# Patient Record
Sex: Female | Born: 1995 | Race: Black or African American | Hispanic: No | Marital: Single | Smoking: Never smoker
Health system: Southern US, Community
[De-identification: ages and names within clinical notes are randomized; demographics above are authoritative.]

## PROBLEM LIST (undated history)

## (undated) DIAGNOSIS — D649 Anemia, unspecified: Secondary | ICD-10-CM

## (undated) DIAGNOSIS — J45909 Unspecified asthma, uncomplicated: Secondary | ICD-10-CM

## (undated) DIAGNOSIS — Z87448 Personal history of other diseases of urinary system: Secondary | ICD-10-CM

## (undated) HISTORY — DX: Personal history of other diseases of urinary system: Z87.448

## (undated) HISTORY — DX: Anemia, unspecified: D64.9

---

## 2020-06-29 ENCOUNTER — Other Ambulatory Visit: Payer: Self-pay

## 2020-06-29 ENCOUNTER — Emergency Department
Admission: EM | Admit: 2020-06-29 | Discharge: 2020-06-29 | Disposition: A | Discharge status: Home / Self Care | Payer: HRSA Program | Attending: Emergency Medicine | Admitting: Emergency Medicine

## 2020-06-29 ENCOUNTER — Encounter: Payer: Self-pay | Admitting: Emergency Medicine

## 2020-06-29 ENCOUNTER — Emergency Department: Payer: HRSA Program

## 2020-06-29 DIAGNOSIS — R509 Fever, unspecified: Secondary | ICD-10-CM | POA: Diagnosis present

## 2020-06-29 DIAGNOSIS — J069 Acute upper respiratory infection, unspecified: Secondary | ICD-10-CM | POA: Diagnosis not present

## 2020-06-29 DIAGNOSIS — Z20822 Contact with and (suspected) exposure to covid-19: Secondary | ICD-10-CM | POA: Diagnosis not present

## 2020-06-29 LAB — URINALYSIS, COMPLETE (UACMP) WITH MICROSCOPIC
Bacteria, UA: NONE SEEN
Bilirubin Urine: NEGATIVE
Glucose, UA: NEGATIVE mg/dL
Hgb urine dipstick: NEGATIVE
Ketones, ur: 5 mg/dL — AB
Leukocytes,Ua: NEGATIVE
Nitrite: NEGATIVE
Protein, ur: 30 mg/dL — AB
Specific Gravity, Urine: 1.027 (ref 1.005–1.030)
pH: 6 (ref 5.0–8.0)

## 2020-06-29 LAB — CBC WITH DIFFERENTIAL/PLATELET
Abs Immature Granulocytes: 0.08 10*3/uL — ABNORMAL HIGH (ref 0.00–0.07)
Basophils Absolute: 0.1 10*3/uL (ref 0.0–0.1)
Basophils Relative: 0 %
Eosinophils Absolute: 0 10*3/uL (ref 0.0–0.5)
Eosinophils Relative: 0 %
HCT: 45.8 % (ref 36.0–46.0)
Hemoglobin: 15 g/dL (ref 12.0–15.0)
Immature Granulocytes: 0 %
Lymphocytes Relative: 8 %
Lymphs Abs: 1.6 10*3/uL (ref 0.7–4.0)
MCH: 29.6 pg (ref 26.0–34.0)
MCHC: 32.8 g/dL (ref 30.0–36.0)
MCV: 90.3 fL (ref 80.0–100.0)
Monocytes Absolute: 1.6 10*3/uL — ABNORMAL HIGH (ref 0.1–1.0)
Monocytes Relative: 8 %
Neutro Abs: 15.9 10*3/uL — ABNORMAL HIGH (ref 1.7–7.7)
Neutrophils Relative %: 84 %
Platelets: 272 10*3/uL (ref 150–400)
RBC: 5.07 MIL/uL (ref 3.87–5.11)
RDW: 12.9 % (ref 11.5–15.5)
WBC: 19.3 10*3/uL — ABNORMAL HIGH (ref 4.0–10.5)
nRBC: 0 % (ref 0.0–0.2)

## 2020-06-29 LAB — COMPREHENSIVE METABOLIC PANEL
ALT: 14 U/L (ref 0–44)
AST: 21 U/L (ref 15–41)
Albumin: 4.8 g/dL (ref 3.5–5.0)
Alkaline Phosphatase: 81 U/L (ref 38–126)
Anion gap: 11 (ref 5–15)
BUN: 12 mg/dL (ref 6–20)
CO2: 22 mmol/L (ref 22–32)
Calcium: 9.3 mg/dL (ref 8.9–10.3)
Chloride: 99 mmol/L (ref 98–111)
Creatinine, Ser: 0.78 mg/dL (ref 0.44–1.00)
GFR calc Af Amer: >60 mL/min (ref >60)
GFR calc non Af Amer: >60 mL/min (ref >60)
Glucose, Bld: 97 mg/dL (ref 70–99)
Potassium: 3.3 mmol/L — ABNORMAL LOW (ref 3.5–5.1)
Sodium: 132 mmol/L — ABNORMAL LOW (ref 135–145)
Total Bilirubin: 1 mg/dL (ref 0.3–1.2)
Total Protein: 8.9 g/dL — ABNORMAL HIGH (ref 6.5–8.1)

## 2020-06-29 LAB — SARS CORONAVIRUS 2 BY RT PCR (HOSPITAL ORDER, PERFORMED IN ~~LOC~~ HOSPITAL LAB): SARS Coronavirus 2: NEGATIVE

## 2020-06-29 MED ORDER — ACETAMINOPHEN 325 MG PO TABS
650.0000 mg | ORAL_TABLET | Freq: Once | ORAL | Status: AC
  Administered 2020-06-29: 650 mg via ORAL
  Filled 2020-06-29: qty 2

## 2020-06-29 MED ORDER — SODIUM CHLORIDE 0.9 % IV BOLUS
1000.0000 mL | Freq: Once | INTRAVENOUS | Status: AC
  Administered 2020-06-29: 1000 mL via INTRAVENOUS

## 2020-06-29 MED ORDER — ONDANSETRON HCL 4 MG/2ML IJ SOLN
4.0000 mg | Freq: Once | INTRAMUSCULAR | Status: AC
  Administered 2020-06-29: 4 mg via INTRAVENOUS
  Filled 2020-06-29: qty 2

## 2020-06-29 MED ORDER — AZITHROMYCIN 250 MG PO TABS
ORAL_TABLET | ORAL | 0 refills | Status: DC
Start: 2020-06-29 — End: 2022-05-09

## 2020-06-29 MED ORDER — AMOXICILLIN-POT CLAVULANATE 875-125 MG PO TABS
1.0000 | ORAL_TABLET | Freq: Two times a day (BID) | ORAL | 0 refills | Status: AC
Start: 2020-06-29 — End: 2020-07-06

## 2020-06-29 MED ORDER — METHYLPREDNISOLONE 4 MG PO TBPK
ORAL_TABLET | ORAL | 0 refills | Status: DC
Start: 2020-06-29 — End: 2022-05-09

## 2020-06-29 MED ORDER — MORPHINE SULFATE (PF) 4 MG/ML IV SOLN
4.0000 mg | Freq: Once | INTRAVENOUS | Status: AC
  Administered 2020-06-29: 4 mg via INTRAVENOUS
  Filled 2020-06-29: qty 1

## 2020-06-29 NOTE — ED Notes (Signed)
See triage note- pt reports cold chills, fevers at home, cough, and sore throat x 2 days.

## 2020-06-29 NOTE — ED Triage Notes (Signed)
Here for sore throat, loss of taste and smell, congestion, headache, and burning in abdomen. Works at hospital.  Has had fever.  Took tylenol this AM.  Ambulatory, VSS

## 2020-06-29 NOTE — ED Provider Notes (Signed)
[facility] Emergency Department Provider Note  ____________________________________________   First MD Initiated Contact with Patient 06/29/20 1112     (approximate)  I have reviewed the triage vital signs and the nursing notes.   HISTORY  Chief Complaint Nasal Congestion    HPI Michaela Roberson is a 24 y.o. female presents emergency department complaint Covid symptoms.  Patient reports fever, body aches, chills, cough and sore throat for 2 days.  States she cannot taste or smell.  Some headache and burning in abdomen.  States she works at [employer].  Has not had Covid vaccine.    History reviewed. No pertinent past medical history.  There are no problems to display for this patient.   History reviewed. No pertinent surgical history.  Prior to Admission medications   Medication Sig Start Date End Date Taking? Authorizing Provider  azithromycin (ZITHROMAX Z-PAK) 250 MG tablet 2 pills today then 1 pill a day for 4 days 06/29/20   Sherrie Mustache Roselyn Bering, PA-C  methylPREDNISolone (MEDROL DOSEPAK) 4 MG TBPK tablet Take 6 pills on day one then decrease by 1 pill each day 06/29/20   Faythe Ghee, PA-C    Allergies Other  History reviewed. No pertinent family history.  Social History Social History   Tobacco Use  . Smoking status: Never Smoker  . Smokeless tobacco: Never Used  Substance Use Topics  . Alcohol use: Never  . Drug use: Never    Review of Systems  Constitutional: Positive fever/chills Eyes: No visual changes. ENT: Positive sore throat. Respiratory: Positive cough Cardiovascular: Denies chest pain Gastrointestinal: Denies abdominal pain,  no vomiting or diarrhea Genitourinary: Negative for dysuria. Musculoskeletal: Negative for back pain. Skin: Negative for rash. Psychiatric: no mood changes,     ____________________________________________   PHYSICAL EXAM:  VITAL SIGNS: ED Triage Vitals [06/29/20 1049]  Enc Vitals  Group     BP (!) 112/90     Pulse Rate 100     Resp 18     Temp 100.3 F (37.9 C)     Temp Source Oral     SpO2 100 %     Weight 125 lb (56.7 kg)     Height 5\' 6"  (1.676 m)     Head Circumference      Peak Flow      Pain Score 9     Pain Loc      Pain Edu?      Excl. in GC?     Constitutional: Alert and oriented. Well appearing and in no acute distress. Eyes: Conjunctivae are normal.  Head: Atraumatic. Nose: Positive congestion/rhinnorhea. Mouth/Throat: Mucous membranes are moist.   Neck:  supple no lymphadenopathy noted Cardiovascular: Normal rate, regular rhythm. Heart sounds are normal Respiratory: Normal respiratory effort.  No retractions, lungs c t a  GU: deferred Musculoskeletal: FROM all extremities, warm and well perfused Neurologic:  Normal speech and language.  Skin:  Skin is warm, dry and intact. No rash noted. Psychiatric: Mood and affect are normal. Speech and behavior are normal.  ____________________________________________   LABS (all labs ordered are listed, but only abnormal results are displayed)  Labs Reviewed  COMPREHENSIVE METABOLIC PANEL - Abnormal; Notable for the following components:      Result Value   Sodium 132 (*)    Potassium 3.3 (*)    Total Protein 8.9 (*)    All other components within normal limits  CBC WITH DIFFERENTIAL/PLATELET - Abnormal; Notable for the following components:   WBC  19.3 (*)    Neutro Abs 15.9 (*)    Monocytes Absolute 1.6 (*)    Abs Immature Granulocytes 0.08 (*)    All other components within normal limits  URINALYSIS, COMPLETE (UACMP) WITH MICROSCOPIC - Abnormal; Notable for the following components:   Color, Urine YELLOW (*)    APPearance CLOUDY (*)    Ketones, ur 5 (*)    Protein, ur 30 (*)    All other components within normal limits  SARS CORONAVIRUS 2 BY RT PCR (HOSPITAL ORDER, PERFORMED IN  HOSPITAL LAB)    ____________________________________________   ____________________________________________  RADIOLOGY  Chest x-ray is normal  ____________________________________________   PROCEDURES  Procedure(s) performed: No  Procedures    ____________________________________________   INITIAL IMPRESSION / ASSESSMENT AND PLAN / ED COURSE  Pertinent labs & imaging results that were available during my care of the patient were reviewed by me and considered in my medical decision making (see chart for details).   Patient is 24 year old female presents emergency department with Covid-like symptoms including loss of taste and smell.  See HPI  Physical exam shows patient to appear stable.  Low-grade temp, O2 saturation is 100% on room air, positive for large amount of nasal congestion, remainder the exam is unremarkable  DDx: COVID-19, pneumonia, URI  Chest x-ray and Covid test ordered   Chest x-ray does not show pneumonia, Covid is surprisingly negative which I think is a false negative due to the patient's loss of sense of taste and smell along with URI symptoms.  Patient is argumentative at this time.  So we will do lab work and give her fluids.  She wants pain medication which we gave her morphine while she is here.  She will not be given a narcotic at discharge and has already been instructed about this.  CBC has elevated WBC, urinalysis is normal, comprehensive metabolic panel is normal  Did discuss findings with patient.  I did once again tell her I feel that this is Covid and she should remain quarantined until she has had a repeat test in 2-3, if at that time the test is negative and her symptoms have improved she may return to work.  If positive she will need to continue to quarantine for an additional 10 days.  She was given a work note stating the same.  A copy of her negative Covid test.  And discharged stable condition with prescription for Z-Pak and Medrol Dosepak.   As  part of my medical decision making, I reviewed the following data within the electronic MEDICAL RECORD NUMBER Nursing notes reviewed and incorporated, Labs reviewed , Old chart reviewed, Radiograph reviewed , Notes from prior ED visits and [place] Controlled Substance Database  ____________________________________________   FINAL CLINICAL IMPRESSION(S) / ED DIAGNOSES  Final diagnoses:  Suspected COVID-19 virus infection  Acute URI      NEW MEDICATIONS STARTED DURING THIS VISIT:  Discharge Medication List as of 06/29/2020  3:57 PM    START taking these medications   Details  azithromycin (ZITHROMAX Z-PAK) 250 MG tablet 2 pills today then 1 pill a day for 4 days, Print    methylPREDNISolone (MEDROL DOSEPAK) 4 MG TBPK tablet Take 6 pills on day one then decrease by 1 pill each day, Print         Note:  This document was prepared using Dragon voice recognition software and may include unintentional dictation errors.    Faythe Ghee, PA-C 06/29/20 1614    Jene Every,  MD 06/30/20 0404

## 2020-06-29 NOTE — ED Notes (Signed)
See triage note  Presents with low grade temp and nasal congestion  States she developed sore throat and fever 2 days ago

## 2020-06-29 NOTE — Discharge Instructions (Signed)
Follow-up with your employee health at work.  Even though your Covid test is negative you have the classic symptoms of Covid.  Feel that you need an additional test in approximately 2 to 3 days.  You should stay at home and remain quarantined due to your symptoms.  Return the emergency department if worsening.  If you have a second test and it is negative and your symptoms have improved you may return to work at that time.

## 2020-11-07 ENCOUNTER — Emergency Department: Admission: EM | Admit: 2020-11-07 | Discharge: 2020-11-07 | Discharge status: Against Medical Advice | Payer: Self-pay

## 2020-11-07 NOTE — ED Notes (Signed)
Jeannett Senior RN to triage 3 to triage patient and patient was not in the room.

## 2021-03-31 ENCOUNTER — Other Ambulatory Visit: Payer: Self-pay

## 2021-03-31 ENCOUNTER — Emergency Department
Admission: EM | Admit: 2021-03-31 | Discharge: 2021-03-31 | Disposition: A | Discharge status: Home / Self Care | Payer: Self-pay | Attending: Emergency Medicine | Admitting: Emergency Medicine

## 2021-03-31 ENCOUNTER — Emergency Department: Payer: Self-pay

## 2021-03-31 DIAGNOSIS — J45909 Unspecified asthma, uncomplicated: Secondary | ICD-10-CM | POA: Insufficient documentation

## 2021-03-31 DIAGNOSIS — K529 Noninfective gastroenteritis and colitis, unspecified: Secondary | ICD-10-CM | POA: Insufficient documentation

## 2021-03-31 HISTORY — DX: Unspecified asthma, uncomplicated: J45.909

## 2021-03-31 LAB — URINALYSIS, COMPLETE (UACMP) WITH MICROSCOPIC
Bacteria, UA: NONE SEEN
Bilirubin Urine: NEGATIVE
Glucose, UA: NEGATIVE mg/dL
Ketones, ur: 80 mg/dL — AB
Leukocytes,Ua: NEGATIVE
Nitrite: NEGATIVE
Protein, ur: 30 mg/dL — AB
Specific Gravity, Urine: 1.023 (ref 1.005–1.030)
pH: 6 (ref 5.0–8.0)

## 2021-03-31 LAB — CBC WITH DIFFERENTIAL/PLATELET
Abs Immature Granulocytes: 0.03 10*3/uL (ref 0.00–0.07)
Basophils Absolute: 0 10*3/uL (ref 0.0–0.1)
Basophils Relative: 0 %
Eosinophils Absolute: 0.1 10*3/uL (ref 0.0–0.5)
Eosinophils Relative: 1 %
HCT: 42.3 % (ref 36.0–46.0)
Hemoglobin: 14 g/dL (ref 12.0–15.0)
Immature Granulocytes: 0 %
Lymphocytes Relative: 8 %
Lymphs Abs: 1 10*3/uL (ref 0.7–4.0)
MCH: 29 pg (ref 26.0–34.0)
MCHC: 33.1 g/dL (ref 30.0–36.0)
MCV: 87.6 fL (ref 80.0–100.0)
Monocytes Absolute: 1.5 10*3/uL — ABNORMAL HIGH (ref 0.1–1.0)
Monocytes Relative: 12 %
Neutro Abs: 9.3 10*3/uL — ABNORMAL HIGH (ref 1.7–7.7)
Neutrophils Relative %: 79 %
Platelets: 281 10*3/uL (ref 150–400)
RBC: 4.83 MIL/uL (ref 3.87–5.11)
RDW: 13.2 % (ref 11.5–15.5)
WBC: 12 10*3/uL — ABNORMAL HIGH (ref 4.0–10.5)
nRBC: 0 % (ref 0.0–0.2)

## 2021-03-31 LAB — COMPREHENSIVE METABOLIC PANEL
ALT: 11 U/L (ref 0–44)
AST: 19 U/L (ref 15–41)
Albumin: 4.5 g/dL (ref 3.5–5.0)
Alkaline Phosphatase: 81 U/L (ref 38–126)
Anion gap: 12 (ref 5–15)
BUN: 8 mg/dL (ref 6–20)
CO2: 25 mmol/L (ref 22–32)
Calcium: 9.2 mg/dL (ref 8.9–10.3)
Chloride: 99 mmol/L (ref 98–111)
Creatinine, Ser: 0.8 mg/dL (ref 0.44–1.00)
GFR, Estimated: >60 mL/min (ref >60)
Glucose, Bld: 101 mg/dL — ABNORMAL HIGH (ref 70–99)
Potassium: 3.1 mmol/L — ABNORMAL LOW (ref 3.5–5.1)
Sodium: 136 mmol/L (ref 135–145)
Total Bilirubin: 0.7 mg/dL (ref 0.3–1.2)
Total Protein: 8.6 g/dL — ABNORMAL HIGH (ref 6.5–8.1)

## 2021-03-31 LAB — POC URINE PREG, ED: Preg Test, Ur: NEGATIVE

## 2021-03-31 LAB — LIPASE, BLOOD: Lipase: 32 U/L (ref 11–51)

## 2021-03-31 MED ORDER — IOHEXOL 300 MG/ML  SOLN
100.0000 mL | Freq: Once | INTRAMUSCULAR | Status: AC | PRN
  Administered 2021-03-31: 100 mL via INTRAVENOUS
  Filled 2021-03-31: qty 100

## 2021-03-31 MED ORDER — PROCHLORPERAZINE EDISYLATE 10 MG/2ML IJ SOLN
10.0000 mg | Freq: Once | INTRAMUSCULAR | Status: AC
  Administered 2021-03-31: 10 mg via INTRAVENOUS
  Filled 2021-03-31: qty 2

## 2021-03-31 MED ORDER — KETOROLAC TROMETHAMINE 30 MG/ML IJ SOLN
15.0000 mg | Freq: Once | INTRAMUSCULAR | Status: AC
  Administered 2021-03-31: 15 mg via INTRAVENOUS
  Filled 2021-03-31: qty 1

## 2021-03-31 MED ORDER — LACTATED RINGERS IV BOLUS
1000.0000 mL | Freq: Once | INTRAVENOUS | Status: AC
  Administered 2021-03-31: 1000 mL via INTRAVENOUS

## 2021-03-31 MED ORDER — POTASSIUM CHLORIDE CRYS ER 20 MEQ PO TBCR
40.0000 meq | EXTENDED_RELEASE_TABLET | Freq: Once | ORAL | Status: AC
  Administered 2021-03-31: 40 meq via ORAL
  Filled 2021-03-31: qty 2

## 2021-03-31 MED ORDER — ONDANSETRON 4 MG PO TBDP: 4.0000 mg | ORAL_TABLET | Freq: Three times a day (TID) | ORAL | 0 refills | Status: DC | PRN

## 2021-03-31 MED ORDER — ONDANSETRON HCL 4 MG/2ML IJ SOLN
4.0000 mg | Freq: Once | INTRAMUSCULAR | Status: AC
  Administered 2021-03-31: 4 mg via INTRAVENOUS
  Filled 2021-03-31: qty 2

## 2021-03-31 MED ORDER — ALUM & MAG HYDROXIDE-SIMETH 200-200-20 MG/5ML PO SUSP
15.0000 mL | Freq: Once | ORAL | Status: AC
  Administered 2021-03-31: 15 mL via ORAL
  Filled 2021-03-31: qty 30

## 2021-03-31 MED ORDER — LIDOCAINE VISCOUS HCL 2 % MT SOLN
15.0000 mL | Freq: Once | OROMUCOSAL | Status: AC
  Administered 2021-03-31: 15 mL via ORAL
  Filled 2021-03-31: qty 15

## 2021-03-31 NOTE — ED Notes (Signed)
Patient reports pain to the abdomen, but improvement to nausea/vomiting. Patient reports abdominal pain is "burning." Dr. Larinda Buttery notified, awaiting new orders.

## 2021-03-31 NOTE — ED Notes (Signed)
Patient is resting comfortably. Patient reports improvement to pain and nausea. Patient resting without complaints.

## 2021-03-31 NOTE — ED Notes (Signed)
Patient reports her LMP is now, and started on Monday of this week.

## 2021-03-31 NOTE — ED Notes (Signed)
Patient provided urine specimen cup for urine sample.

## 2021-03-31 NOTE — ED Notes (Signed)
See triage note - patient reports nausea, vomiting, body aches, and chills x4 days. Patient also reports redness to both eyes. Patient requesting ice on arrival "for my stomach burning."

## 2021-03-31 NOTE — ED Provider Notes (Signed)
[facility] Emergency Department Provider Note   ____________________________________________   Event Date/Time   First MD Initiated Contact with Patient 03/31/21 1133     (approximate)  I have reviewed the triage vital signs and the nursing notes.   HISTORY  Chief Complaint Emesis and URI    HPI Michaela Roberson is a 25 y.o. female with past medical history of asthma who presents to the ED complaining of nausea and vomiting.  Patient reports that she has had 3 days of nausea with frequent episodes of vomiting.  She has been unable to keep down either liquids or solids.  This was associated with diarrhea around the start that has since resolved.  She complains of some soreness in her upper abdomen, which she attributes to vomiting.  She denies any fevers, chills, cough, chest pain, or shortness of breath.  She has not had any dysuria or hematuria.  Her children were sick with similar symptoms last week.        Past Medical History:  Diagnosis Date  . Asthma     There are no problems to display for this patient.   Past Surgical History:  Procedure Laterality Date  . CESAREAN SECTION      Prior to Admission medications   Medication Sig Start Date End Date Taking? Authorizing Provider  ondansetron (ZOFRAN ODT) 4 MG disintegrating tablet Take 1 tablet (4 mg total) by mouth every 8 (eight) hours as needed for nausea or vomiting. 03/31/21  Yes Chesley Noon, MD  azithromycin (ZITHROMAX Z-PAK) 250 MG tablet 2 pills today then 1 pill a day for 4 days 06/29/20   Faythe Ghee, PA-C  methylPREDNISolone (MEDROL DOSEPAK) 4 MG TBPK tablet Take 6 pills on day one then decrease by 1 pill each day 06/29/20   Faythe Ghee, PA-C    Allergies Other  No family history on file.  Social History Social History   Tobacco Use  . Smoking status: Never Smoker  . Smokeless tobacco: Never Used  Substance Use Topics  . Alcohol use: Never  . Drug use: Never     Review of Systems  Constitutional: No fever/chills Eyes: No visual changes. ENT: No sore throat. Cardiovascular: Denies chest pain. Respiratory: Denies shortness of breath. Gastrointestinal: Positive for abdominal pain, nausea, vomiting, and diarrhea.  No constipation. Genitourinary: Negative for dysuria. Musculoskeletal: Negative for back pain. Skin: Negative for rash. Neurological: Negative for headaches, focal weakness or numbness.  ____________________________________________   PHYSICAL EXAM:  VITAL SIGNS: ED Triage Vitals  Enc Vitals Group     BP 03/31/21 1123 108/69     Pulse Rate 03/31/21 1123 (!) 114     Resp 03/31/21 1123 18     Temp 03/31/21 1131 98.9 F (37.2 C)     Temp Source 03/31/21 1123 Oral     SpO2 03/31/21 1123 100 %     Weight 03/31/21 1124 142 lb (64.4 kg)     Height 03/31/21 1124 5\' 5"  (1.651 m)     Head Circumference --      Peak Flow --      Pain Score 03/31/21 1123 7     Pain Loc --      Pain Edu? --      Excl. in GC? --     Constitutional: Alert and oriented. Eyes: Conjunctivae are normal. Head: Atraumatic. Nose: No congestion/rhinnorhea. Mouth/Throat: Mucous membranes are moist. Neck: Normal ROM Cardiovascular: Normal rate, regular rhythm. Grossly normal heart sounds. Respiratory: Normal respiratory  effort.  No retractions. Lungs CTAB. Gastrointestinal: Soft and nontender. No distention. Genitourinary: deferred Musculoskeletal: No lower extremity tenderness nor edema. Neurologic:  Normal speech and language. No gross focal neurologic deficits are appreciated. Skin:  Skin is warm, dry and intact. No rash noted. Psychiatric: Mood and affect are normal. Speech and behavior are normal.  ____________________________________________   LABS (all labs ordered are listed, but only abnormal results are displayed)  Labs Reviewed  URINALYSIS, COMPLETE (UACMP) WITH MICROSCOPIC - Abnormal; Notable for the following components:      Result  Value   Color, Urine YELLOW (*)    APPearance HAZY (*)    Hgb urine dipstick LARGE (*)    Ketones, ur 80 (*)    Protein, ur 30 (*)    All other components within normal limits  CBC WITH DIFFERENTIAL/PLATELET - Abnormal; Notable for the following components:   WBC 12.0 (*)    Neutro Abs 9.3 (*)    Monocytes Absolute 1.5 (*)    All other components within normal limits  COMPREHENSIVE METABOLIC PANEL - Abnormal; Notable for the following components:   Potassium 3.1 (*)    Glucose, Bld 101 (*)    Total Protein 8.6 (*)    All other components within normal limits  LIPASE, BLOOD  POC URINE PREG, ED     PROCEDURES  Procedure(s) performed (including Critical Care):  Procedures   ____________________________________________   INITIAL IMPRESSION / ASSESSMENT AND PLAN / ED COURSE       25 year old female with past medical history of asthma who presents to the ED with 3 days of constant nausea and vomiting initially associated with diarrhea.  She reports some soreness in her upper abdomen but has no tenderness on exam, I suspect this is related to gastritis versus gastroenteritis.  We will check UA and pregnancy testing, screen labs, treat symptomatically with IV fluids and Zofran.  Pregnancy testing is negative and UA is unremarkable.  Lab work is also reassuring but patient continues to complain of abdominal pain and nausea, unfortunately vomited up a GI cocktail she was given.  She was given a dose of Compazine with Toradol, CT scan performed and negative for acute process.  She is appropriate for discharge home with PCP follow-up, was counseled to return to the ED for new or worsening symptoms, patient agrees with plan.      ____________________________________________   FINAL CLINICAL IMPRESSION(S) / ED DIAGNOSES  Final diagnoses:  Gastroenteritis     ED Discharge Orders         Ordered    ondansetron (ZOFRAN ODT) 4 MG disintegrating tablet  Every 8 hours PRN         03/31/21 1425           Note:  This document was prepared using Dragon voice recognition software and may include unintentional dictation errors.   Chesley Noon, MD 03/31/21 1426

## 2021-03-31 NOTE — ED Notes (Signed)
Patient transported to CT 

## 2021-03-31 NOTE — ED Triage Notes (Signed)
Pt c/o N/V with body aches fever with chest and sinus congestion for the past 3-4 days.

## 2021-03-31 NOTE — ED Notes (Signed)
Patient reports some difficulty breathing after standing at discharge, but reports improvement after walking in room. Patient respirations even and unlabored. Patient vital signs obtained, and documented. Vitals WDL, including oxygent saturation. This RN offered to get the MD to re-evaluate patient, but patient declined. Patient reports she is ready to be discharged. Patient discharged ambulatory to exit for ride from friend. Patient ambulatory with steady gait.

## 2021-11-04 ENCOUNTER — Emergency Department: Payer: Medicaid Other

## 2021-11-04 DIAGNOSIS — J45909 Unspecified asthma, uncomplicated: Secondary | ICD-10-CM | POA: Insufficient documentation

## 2021-11-04 DIAGNOSIS — O26891 Other specified pregnancy related conditions, first trimester: Secondary | ICD-10-CM | POA: Diagnosis not present

## 2021-11-04 DIAGNOSIS — O210 Mild hyperemesis gravidarum: Secondary | ICD-10-CM | POA: Diagnosis not present

## 2021-11-04 DIAGNOSIS — Z3A01 Less than 8 weeks gestation of pregnancy: Secondary | ICD-10-CM | POA: Diagnosis not present

## 2021-11-04 DIAGNOSIS — R1030 Lower abdominal pain, unspecified: Secondary | ICD-10-CM | POA: Insufficient documentation

## 2021-11-04 DIAGNOSIS — O26899 Other specified pregnancy related conditions, unspecified trimester: Secondary | ICD-10-CM

## 2021-11-04 LAB — CBC WITH DIFFERENTIAL/PLATELET
Abs Immature Granulocytes: 0.05 10*3/uL (ref 0.00–0.07)
Basophils Absolute: 0.1 10*3/uL (ref 0.0–0.1)
Basophils Relative: 0 %
Eosinophils Absolute: 0.1 10*3/uL (ref 0.0–0.5)
Eosinophils Relative: 1 %
HCT: 39.9 % (ref 36.0–46.0)
Hemoglobin: 13.4 g/dL (ref 12.0–15.0)
Immature Granulocytes: 0 %
Lymphocytes Relative: 17 %
Lymphs Abs: 2.2 10*3/uL (ref 0.7–4.0)
MCH: 29.6 pg (ref 26.0–34.0)
MCHC: 33.6 g/dL (ref 30.0–36.0)
MCV: 88.3 fL (ref 80.0–100.0)
Monocytes Absolute: 1.2 10*3/uL — ABNORMAL HIGH (ref 0.1–1.0)
Monocytes Relative: 9 %
Neutro Abs: 9.2 10*3/uL — ABNORMAL HIGH (ref 1.7–7.7)
Neutrophils Relative %: 73 %
Platelets: 283 10*3/uL (ref 150–400)
RBC: 4.52 MIL/uL (ref 3.87–5.11)
RDW: 13.2 % (ref 11.5–15.5)
WBC: 12.7 10*3/uL — ABNORMAL HIGH (ref 4.0–10.5)
nRBC: 0 % (ref 0.0–0.2)

## 2021-11-04 LAB — COMPREHENSIVE METABOLIC PANEL
ALT: 26 U/L (ref 0–44)
AST: 23 U/L (ref 15–41)
Albumin: 4.3 g/dL (ref 3.5–5.0)
Alkaline Phosphatase: 82 U/L (ref 38–126)
Anion gap: 5 (ref 5–15)
BUN: 8 mg/dL (ref 6–20)
CO2: 26 mmol/L (ref 22–32)
Calcium: 9 mg/dL (ref 8.9–10.3)
Chloride: 101 mmol/L (ref 98–111)
Creatinine, Ser: 0.66 mg/dL (ref 0.44–1.00)
GFR, Estimated: >60 mL/min (ref >60)
Glucose, Bld: 87 mg/dL (ref 70–99)
Potassium: 3.6 mmol/L (ref 3.5–5.1)
Sodium: 132 mmol/L — ABNORMAL LOW (ref 135–145)
Total Bilirubin: 1 mg/dL (ref 0.3–1.2)
Total Protein: 8.3 g/dL — ABNORMAL HIGH (ref 6.5–8.1)

## 2021-11-04 LAB — TYPE AND SCREEN
ABO/RH(D): O POS
Antibody Screen: NEGATIVE

## 2021-11-04 LAB — HCG, QUANTITATIVE, PREGNANCY: hCG, Beta Chain, Quant, S: 95887 m[IU]/mL — ABNORMAL HIGH (ref <5)

## 2021-11-04 NOTE — ED Triage Notes (Signed)
Pt states she took a pregnancy test on Thanksgiving that was positive. States that she started vomiting on Monday. Reports lower  central pelvic pain that started on 11/26. Denies any vaginal bleeding. Endorses vaginal discharge.

## 2021-11-05 ENCOUNTER — Emergency Department
Admission: EM | Admit: 2021-11-05 | Discharge: 2021-11-05 | Disposition: A | Discharge status: Home / Self Care | Payer: Medicaid Other | Attending: Emergency Medicine | Admitting: Emergency Medicine

## 2021-11-05 DIAGNOSIS — R109 Unspecified abdominal pain: Secondary | ICD-10-CM

## 2021-11-05 DIAGNOSIS — O26899 Other specified pregnancy related conditions, unspecified trimester: Secondary | ICD-10-CM

## 2021-11-05 DIAGNOSIS — O210 Mild hyperemesis gravidarum: Secondary | ICD-10-CM

## 2021-11-05 DIAGNOSIS — O26891 Other specified pregnancy related conditions, first trimester: Secondary | ICD-10-CM

## 2021-11-05 LAB — URINALYSIS, ROUTINE W REFLEX MICROSCOPIC
Bilirubin Urine: NEGATIVE
Glucose, UA: NEGATIVE mg/dL
Hgb urine dipstick: NEGATIVE
Ketones, ur: 20 mg/dL — AB
Nitrite: NEGATIVE
Protein, ur: NEGATIVE mg/dL
Specific Gravity, Urine: 1.01 (ref 1.005–1.030)
pH: 7 (ref 5.0–8.0)

## 2021-11-05 MED ORDER — METOCLOPRAMIDE HCL 10 MG PO TABS: 10.0000 mg | ORAL_TABLET | Freq: Three times a day (TID) | ORAL | 0 refills | Status: DC | PRN

## 2021-11-05 MED ORDER — SODIUM CHLORIDE 0.9 % IV BOLUS
1000.0000 mL | Freq: Once | INTRAVENOUS | Status: AC
Start: 2021-11-05 — End: 2021-11-05
  Administered 2021-11-05: 1000 mL via INTRAVENOUS

## 2021-11-05 MED ORDER — METOCLOPRAMIDE HCL 5 MG/ML IJ SOLN
10.0000 mg | Freq: Once | INTRAMUSCULAR | Status: AC
  Administered 2021-11-05: 10 mg via INTRAVENOUS
  Filled 2021-11-05: qty 2

## 2021-11-05 MED ORDER — ONDANSETRON 4 MG PO TBDP
4.0000 mg | ORAL_TABLET | Freq: Once | ORAL | Status: AC
  Administered 2021-11-05: 4 mg via ORAL
  Filled 2021-11-05: qty 1

## 2021-11-05 NOTE — ED Notes (Signed)
Pt wants to wait til they are back in a room before giving more blood. Pt unsure why they need more blood when they took a lot earlier. Explained that I will talk to nurse to see why they need more blood from pt.

## 2021-11-05 NOTE — Discharge Instructions (Addendum)
Return to the ER for new, worsening, or persistent severe abdominal pain, vaginal bleeding, persistent vomiting, inability to hold anything down, feeling weak or lightheaded, or any other new or worsening symptoms that concern you.  You may take over-the-counter Tylenol for pain, and the prescribed Reglan for nausea and vomiting.

## 2021-11-05 NOTE — ED Provider Notes (Signed)
[facility] Emergency Department Provider Note ____________________________________________   Event Date/Time   First MD Initiated Contact with Patient 11/05/21 0422     (approximate)  I have reviewed the triage vital signs and the nursing notes.   HISTORY  Chief Complaint Abdominal Cramping and Morning Sickness    HPI Michaela Roberson is a 25 y.o. female G5P4 at 6 weeks who presents with nausea and vomiting over the last 5 days, persistent course, associated with suprapubic abdominal pain but no diarrhea or fever.  The patient denies any dysuria, vaginal bleeding, or vaginal discharge.  She has no fever or chills.  She reports decreased p.o. intake.  Past Medical History:  Diagnosis Date   Asthma     There are no problems to display for this patient.   Past Surgical History:  Procedure Laterality Date   CESAREAN SECTION      Prior to Admission medications   Medication Sig Start Date End Date Taking? Authorizing Provider  metoCLOPramide (REGLAN) 10 MG tablet Take 1 tablet (10 mg total) by mouth every 8 (eight) hours as needed for nausea or vomiting. 11/05/21 12/05/21 Yes Dionne Bucy, MD  azithromycin (ZITHROMAX Z-PAK) 250 MG tablet 2 pills today then 1 pill a day for 4 days 06/29/20   Sherrie Mustache Roselyn Bering, PA-C  methylPREDNISolone (MEDROL DOSEPAK) 4 MG TBPK tablet Take 6 pills on day one then decrease by 1 pill each day 06/29/20   Faythe Ghee, PA-C  ondansetron (ZOFRAN ODT) 4 MG disintegrating tablet Take 1 tablet (4 mg total) by mouth every 8 (eight) hours as needed for nausea or vomiting. 03/31/21   Chesley Noon, MD    Allergies Other  No family history on file.  Social History Social History   Tobacco Use   Smoking status: Never   Smokeless tobacco: Never  Substance Use Topics   Alcohol use: Never   Drug use: Never    Review of Systems  Constitutional: No fever/chills Eyes: No visual changes. ENT: No sore  throat. Cardiovascular: Denies chest pain. Respiratory: Denies shortness of breath. Gastrointestinal: Positive for nausea and vomiting. Genitourinary: Negative for dysuria.  Negative for vaginal bleeding or discharge. Musculoskeletal: Negative for back pain. Skin: Negative for rash. Neurological: Negative for headaches, focal weakness or numbness.   ____________________________________________   PHYSICAL EXAM:  VITAL SIGNS: ED Triage Vitals  Enc Vitals Group     BP 11/04/21 2046 110/81     Pulse Rate 11/04/21 2046 85     Resp 11/04/21 2046 18     Temp 11/04/21 2046 98.1 F (36.7 C)     Temp Source 11/04/21 2046 Oral     SpO2 11/04/21 2046 95 %     Weight 11/04/21 2048 140 lb (63.5 kg)     Height --      Head Circumference --      Peak Flow --      Pain Score 11/04/21 2047 10     Pain Loc --      Pain Edu? --      Excl. in GC? --     Constitutional: Alert and oriented.  Relatively well appearing and in no acute distress. Eyes: Conjunctivae are normal.  Head: Atraumatic. Nose: No congestion/rhinnorhea. Mouth/Throat: Mucous membranes are slightly dry. Neck: Normal range of motion.  Cardiovascular: Normal rate, regular rhythm. Grossly normal heart sounds.  Good peripheral circulation. Respiratory: Normal respiratory effort.  No retractions. Lungs CTAB. Gastrointestinal: Soft with mild suprapubic midline tenderness.  No distention.  Genitourinary: No flank tenderness. Musculoskeletal: Extremities warm and well perfused.  Neurologic:  Normal speech and language. No gross focal neurologic deficits are appreciated.  Skin:  Skin is warm and dry. No rash noted. Psychiatric: Mood and affect are normal. Speech and behavior are normal.  ____________________________________________   LABS (all labs ordered are listed, but only abnormal results are displayed)  Labs Reviewed  CBC WITH DIFFERENTIAL/PLATELET - Abnormal; Notable for the following components:      Result Value    WBC 12.7 (*)    Neutro Abs 9.2 (*)    Monocytes Absolute 1.2 (*)    All other components within normal limits  COMPREHENSIVE METABOLIC PANEL - Abnormal; Notable for the following components:   Sodium 132 (*)    Total Protein 8.3 (*)    All other components within normal limits  HCG, QUANTITATIVE, PREGNANCY - Abnormal; Notable for the following components:   hCG, Beta Chain, Quant, S 59,563 (*)    All other components within normal limits  URINALYSIS, ROUTINE W REFLEX MICROSCOPIC - Abnormal; Notable for the following components:   Color, Urine YELLOW (*)    APPearance HAZY (*)    Ketones, ur 20 (*)    Leukocytes,Ua SMALL (*)    Bacteria, UA RARE (*)    All other components within normal limits  TYPE AND SCREEN  ABO/RH   ____________________________________________  EKG   ____________________________________________  RADIOLOGY  US OB: IMPRESSION:  Single viable intrauterine pregnancy with small volume subchorionic  hemorrhage, with estimated gestational age of [redacted] weeks and 5 days.   ____________________________________________   PROCEDURES  Procedure(s) performed: No  Procedures  Critical Care performed: No ____________________________________________   INITIAL IMPRESSION / ASSESSMENT AND PLAN / ED COURSE  Pertinent labs & imaging results that were available during my care of the patient were reviewed by me and considered in my medical decision making (see chart for details).   25 year old female G5P1 at approximately 6 weeks presents with nausea and vomiting as well as suprapubic pain over the last several days.  On exam the patient is overall well-appearing.  Her vital signs are normal.  The abdomen is soft and nontender except for some mild midline suprapubic tenderness.  Mucous membranes are slightly dry.  In terms of the nausea and vomiting, overall presentation is most consistent with hyperemesis gravidarum.  Lab work-up obtained from triage is unremarkable.   Electrolytes are normal except for borderline low sodium.  Differential for the abdominal pain includes UTI/cystitis versus round ligament pain or other benign cause.  The patient denies vaginal bleeding or discharge.  Lab work-up reveals mild leukocytosis.  Ultrasound shows an IUP with FHR, and a small subchorionic hemorrhage.  We will give fluids, Reglan, obtain a urinalysis and reassess.  ----------------------------------------- 7:13 AM on 11/05/2021 -----------------------------------------  Urinalysis is negative.  The patient is feeling much better after fluids and Reglan and feels comfortable going home.  I counseled her on the results of the work-up.  She is stable for discharge at this time.  Return precautions given, and she expresses understanding.  ____________________________________________   FINAL CLINICAL IMPRESSION(S) / ED DIAGNOSES  Final diagnoses:  Hyperemesis gravidarum  Abdominal pain during pregnancy in first trimester      NEW MEDICATIONS STARTED DURING THIS VISIT:  New Prescriptions   METOCLOPRAMIDE (REGLAN) 10 MG TABLET    Take 1 tablet (10 mg total) by mouth every 8 (eight) hours as needed for nausea or vomiting.     Note:  This document  was prepared using Conservation officer, historic buildings and may include unintentional dictation errors.    Dionne Bucy, MD 11/05/21 [phone]

## 2021-11-05 NOTE — ED Notes (Signed)
Pt reports she has not been able to drink reports she feels dehydrated, RN asked about her nausea reports she still feels nauseous. Pt demanding to get an IV for fluids, RN informed pt RN needs to talk to MD about getting an order for IVF. Pt upset, yelling to RN in front desk

## 2021-12-23 ENCOUNTER — Ambulatory Visit: Payer: Medicaid Other | Admitting: Advanced Practice Midwife

## 2021-12-23 ENCOUNTER — Telehealth: Payer: Self-pay

## 2021-12-23 ENCOUNTER — Encounter: Payer: Self-pay | Admitting: Advanced Practice Midwife

## 2021-12-23 ENCOUNTER — Other Ambulatory Visit: Payer: Self-pay

## 2021-12-23 VITALS — BP 102/72 | HR 100 | Temp 99.0°F | Ht 66.0 in | Wt 143.0 lb

## 2021-12-23 DIAGNOSIS — J45909 Unspecified asthma, uncomplicated: Secondary | ICD-10-CM | POA: Diagnosis not present

## 2021-12-23 DIAGNOSIS — B9689 Other specified bacterial agents as the cause of diseases classified elsewhere: Secondary | ICD-10-CM

## 2021-12-23 DIAGNOSIS — O99341 Other mental disorders complicating pregnancy, first trimester: Secondary | ICD-10-CM

## 2021-12-23 DIAGNOSIS — O2340 Unspecified infection of urinary tract in pregnancy, unspecified trimester: Secondary | ICD-10-CM | POA: Insufficient documentation

## 2021-12-23 DIAGNOSIS — Z98891 History of uterine scar from previous surgery: Secondary | ICD-10-CM | POA: Insufficient documentation

## 2021-12-23 DIAGNOSIS — O0991 Supervision of high risk pregnancy, unspecified, first trimester: Secondary | ICD-10-CM | POA: Diagnosis not present

## 2021-12-23 DIAGNOSIS — N281 Cyst of kidney, acquired: Secondary | ICD-10-CM

## 2021-12-23 DIAGNOSIS — F5089 Other specified eating disorder: Secondary | ICD-10-CM | POA: Insufficient documentation

## 2021-12-23 DIAGNOSIS — Z62810 Personal history of physical and sexual abuse in childhood: Secondary | ICD-10-CM | POA: Insufficient documentation

## 2021-12-23 DIAGNOSIS — F32A Depression, unspecified: Secondary | ICD-10-CM

## 2021-12-23 DIAGNOSIS — Z8751 Personal history of pre-term labor: Secondary | ICD-10-CM | POA: Insufficient documentation

## 2021-12-23 DIAGNOSIS — N760 Acute vaginitis: Secondary | ICD-10-CM

## 2021-12-23 DIAGNOSIS — O2341 Unspecified infection of urinary tract in pregnancy, first trimester: Secondary | ICD-10-CM

## 2021-12-23 LAB — URINALYSIS
Bilirubin, UA: POSITIVE — AB
Glucose, UA: NEGATIVE
Leukocytes,UA: NEGATIVE
Nitrite, UA: NEGATIVE
RBC, UA: NEGATIVE
Specific Gravity, UA: 1.025 (ref 1.005–1.030)
Urobilinogen, Ur: 2 mg/dL — ABNORMAL HIGH (ref 0.2–1.0)
pH, UA: 7 (ref 5.0–7.5)

## 2021-12-23 LAB — WET PREP FOR TRICH, YEAST, CLUE
Trichomonas Exam: NEGATIVE
Yeast Exam: NEGATIVE

## 2021-12-23 LAB — HEMOGLOBIN, FINGERSTICK: Hemoglobin: 11.5 g/dL (ref 11.1–15.9)

## 2021-12-23 MED ORDER — METRONIDAZOLE 250 MG PO TABS: 250.0000 mg | ORAL_TABLET | Freq: Three times a day (TID) | ORAL | 0 refills | Status: DC

## 2021-12-23 NOTE — Progress Notes (Addendum)
Presents today for initiation of prenatal care. Please see Epic for [facility] visits (includes [place]) due to nausea and vomiting. States presumptively treated for UTI last month and completed all of antibiotic. Taking PNV Gummy daily. ROI for [facility] C-section faxed with confirmation received. ROI for all PAP smear reports faxed to [facility] and fax confirmation received. Per client, received flu vaccine in 07/2021 at [employer] where employed at the time. Rich Number, RN Ola Spurr CNM reviewed wet prep and client treated per standing order per Ms. Sciora. Ms. Chesley Mires also reviewed hgb (11.5) and urine dip. Rich Number, RN Peak flow readings = #1 410, #2 460, #3 460 with good effort. Slight cough elicited with peak flows. Client counseled to expect a call from [facility] regarding [place] appt. 4 week MHC RV appt scheduled and reminder card given. Rich Number, RN [facility] [place] referral faxed to scheduler with snapshot pages and 11/04/2021 [facility] [place] report. Fax confirmation received. Rich Number, RN See K. Brewer-Jensen note regarding PAPs from [place]. Pink sticky note created to assess if ROI needed for [facility] at next [facility] RV in 01/2022. Rich Number, RN

## 2021-12-23 NOTE — Progress Notes (Signed)
[place] Department  [facility]   INITIAL PRENATAL VISIT NOTE  Subjective:  Michaela Roberson is a 26 y.o. SBF [id number] (5 yo twin sons, 79, 45 yo daughters) nonsmoker at [redacted]w[redacted]d being seen today to start prenatal care at the [facility] Department. She feels "ok" about surprise pregnancy with no birth control. 26 yo unemployed FOB feels  "too excited" about pregnancy; he has no children; in off and on 3 year relationship and she thinks he'll be supportive. She was laid off from [employer] in December due to N&V and is living with her 4 kids and is on Michaela Roberson.  LMP unsure maybe 09/20/21. Has been to ER x3 with 3 u/s:  11/05/21 [facility] and given Reglan, u/s 6 5/7 wks with EDC=06/25/22; 11/09/21 at [place] and given Zofran, Phenergan supp, Diclegis, and Keflex for UTI, u/s 7 4/7; 11/13/21 [facility] dx'd with strained muscle and given Flexoril 10 mg, lidocaine patch, u/s 8 2/7 + irregular cystic structure in right kidney 3.14x1.97 cm consistent with prior CT scans. Denies cigs, vaping, cigars. Last MJ 08/2021. Last ETOH 2020 1 [place] me happy. Last vomited yesterday x4 and daily.  States she didn't have Medicaid so never filled any antiemetic rx's received from ER; not taking any antiemetics and doesn' t want any today. Last pap 2018 [facility]).  Last cornstarch pica 2022 and currently 32 oz ice pica daily. PHQ-9=15. +cry 3x/wk, poor sleep, poor appetite, +anhedonia, +moody, irritable, -SI/HI; accepts counseling.  She is currently monitored for the following issues for this high-risk pregnancy and has Depression affecting pregnancy; Supervision of high risk pregnancy in first trimester; History of preterm delivery 36 wk twins 05/21/17; Previous cesarean section 05/21/17 twins; History of low birthweight infant 5#1 05/21/17; Asthma; and Pica ice on their problem list.  Patient reports vomiting 1-4x/day.   Contractions: Not present. Vag. Bleeding: None.  Movement: Absent. Denies leaking of fluid.    Indications for ASA therapy (per uptodate) One of the following: Previous pregnancy with preeclampsia, especially early onset and with an adverse outcome No Multifetal gestation No Chronic hypertension No Type 1 or 2 diabetes mellitus No Chronic kidney disease No Autoimmune disease (antiphospholipid syndrome, systemic lupus erythematosus) No  Two or more of the following: Nulliparity No Obesity (body mass index >30 kg/m2) No Family history of preeclampsia in mother or sister No Age ?35 years No Sociodemographic characteristics (African American race, low socioeconomic level) Yes Personal risk factors (eg, previous pregnancy with low birth weight or small for gestational age infant, previous adverse pregnancy outcome [eg, stillbirth], interval >10 years between pregnancies) No   The following portions of the patient's history were reviewed and updated as appropriate: allergies, current medications, past family history, past medical history, past social history, past surgical history and problem list. Problem list updated.  Objective:   Vitals:   12/23/21 0851 12/23/21 0852  BP: 102/72   Pulse: 100   Temp: 99 F (37.2 C)   Weight: 143 lb (64.9 kg)   Height:  5\' 6"  (1.676 m)    Fetal Status: Fetal Heart Rate (bpm): 160 Fundal Height: 14 cm Movement: Absent  Presentation: Undeterminable  Physical Exam Vitals and nursing note reviewed.  Constitutional:      General: She is not in acute distress.    Appearance: Normal appearance. She is well-developed and normal weight.  HENT:     Head: Normocephalic and atraumatic.     Right Ear: External ear normal.     Left  Ear: External ear normal.     Nose: Nose normal. No congestion or rhinorrhea.     Mouth/Throat:     Lips: Pink.     Mouth: Mucous membranes are moist.     Dentition: Normal dentition. No dental caries.     Pharynx: Oropharynx is clear. Uvula midline.     Comments: Dentition: good, last dental exam 2022 Eyes:      General: No scleral icterus.    Conjunctiva/sclera: Conjunctivae normal.  Neck:     Thyroid: No thyroid mass, thyromegaly or thyroid tenderness.  Cardiovascular:     Rate and Rhythm: Normal rate.     Pulses: Normal pulses.     Comments: Extremities are warm and well perfused Pulmonary:     Effort: Pulmonary effort is normal.     Breath sounds: Normal breath sounds.  Chest:     Chest wall: No mass.  Breasts:    Tanner Score is 5.     Breasts are symmetrical.     Right: Normal. No mass, nipple discharge or skin change.     Left: Normal. No mass, nipple discharge or skin change.  Abdominal:     Palpations: Abdomen is soft.     Tenderness: There is no abdominal tenderness.     Comments: Gravid, soft without masses or tenderness, fundal height=14 wks, FHR=160  Genitourinary:    General: Normal vulva.     Exam position: Lithotomy position.     Pubic Area: No rash.      Labia:        Right: No rash.        Left: No rash.      Vagina: Vaginal discharge (grey malodorous leukorrhea, ph>4.5) present.     Cervix: Normal.     Uterus: Normal. Enlarged (Gravid 14-16 wk size). Not tender.      Rectum: Normal. No external hemorrhoid.     Comments: Pap done Musculoskeletal:     Right lower leg: No edema.     Left lower leg: No edema.  Lymphadenopathy:     Cervical: No cervical adenopathy.     Upper Body:     Right upper body: No axillary adenopathy.     Left upper body: No axillary adenopathy.  Skin:    General: Skin is warm.     Capillary Refill: Capillary refill takes less than 2 seconds.  Neurological:     Mental Status: She is alert.    Assessment and Plan:  Pregnancy: [id number] at [redacted]w[redacted]d  1. Depression affecting pregnancy Agrees to [name], LCSW referral--done PHQ-9=15 - Ambulatory referral to [place]  2. Supervision of high risk pregnancy in first trimester Desires Quad screen Has had 3 u/s's this pregnancy so doesn't need dating. Will need anatomy  u/s--ordered Counseled on weight gain of 25-35 lbs Suggestions given for N&V (pt declines antiemetic);  - Prenatal profile without Varicella/Rubella [id number]) - Urine Culture - Chlamydia/GC NAA, Confirmation - Lead, blood (adult age 66 yrs or greater) - Hgb Fractionation Cascade - HIV-1/HIV-2 Qualitative RNA - HCV Ab w Reflex to Quant PCR - QuantiFERON-TB Gold Plus [id number] Drug Screen - IGP, rfx Aptima HPV ASCU - WET PREP FOR TRICH, YEAST, CLUE - Hemoglobin, venipuncture - Urinalysis (Urine Dip)  3. History of preterm delivery 36 wk twins 05/21/17 Due to fetal distress in 1 twin at 36 wks with no labor  4. Previous cesarean section 05/21/17 twins ROI for delivery notes  5. History of low birthweight infant 5#1 05/21/17 monitor  6. Uncomplicated asthma, unspecified asthma severity, unspecified whether persistent Peak flows today please  7. Pica ice Counseled to stop    Discussed overview of care and coordination with inpatient delivery practices including [facility], [facility], [facility] and [facility].   Reviewed Centering pregnancy as standard of care at [facility] .    Preterm labor symptoms and general obstetric precautions including but not limited to vaginal bleeding, contractions, leaking of fluid and fetal movement were reviewed in detail with the patient.  Please refer to After Visit Summary for other counseling recommendations.   No follow-ups on file.  No future appointments.  Herbie Saxon, CNM

## 2021-12-23 NOTE — Telephone Encounter (Signed)
TC from William Hamburger at the [facility] Department regarding ROI she received for patient Pap test results. Per Carmon Sails, they do not have any Pap records for this patient, and states last time patient was seen in their clinic was several years ago and patient was too young to have a pap test at the time of last appointment. Provider Hazle Coca informed.Burt Knack, RN

## 2021-12-26 LAB — IGP, RFX APTIMA HPV ASCU: PAP Smear Comment: 0

## 2021-12-26 LAB — LEAD, BLOOD (ADULT >= 16 YRS): Lead-Whole Blood: <1 ug/dL (ref 0.0–3.4)

## 2021-12-27 LAB — HGB FRACTIONATION BY HPLC
Hgb A: 97 % (ref 96.4–98.8)
Hgb C: 0 %
Hgb E: 0 %
Hgb F: 0 % (ref 0.0–2.0)
Hgb S: 0 %
Hgb Variant: 1.4 % — ABNORMAL HIGH

## 2021-12-27 LAB — QUANTIFERON-TB GOLD PLUS
QuantiFERON Mitogen Value: >10 IU/mL
QuantiFERON Nil Value: 0.07 IU/mL
QuantiFERON TB1 Ag Value: 0.11 IU/mL
QuantiFERON TB2 Ag Value: 0.07 IU/mL
QuantiFERON-TB Gold Plus: NEGATIVE

## 2021-12-27 LAB — CHLAMYDIA/GC NAA, CONFIRMATION
Chlamydia trachomatis, NAA: NEGATIVE
Neisseria gonorrhoeae, NAA: NEGATIVE

## 2021-12-27 LAB — URINE CULTURE

## 2021-12-27 LAB — HGB FRACTIONATION CASCADE: Hgb A2: 1.6 % — ABNORMAL LOW (ref 1.8–3.2)

## 2021-12-27 LAB — HIV-1/HIV-2 QUALITATIVE RNA
HIV-1 RNA, Qualitative: NONREACTIVE
HIV-2 RNA, Qualitative: NONREACTIVE

## 2021-12-28 ENCOUNTER — Encounter: Payer: Self-pay | Admitting: Advanced Practice Midwife

## 2021-12-28 DIAGNOSIS — D569 Thalassemia, unspecified: Secondary | ICD-10-CM | POA: Insufficient documentation

## 2021-12-28 LAB — AB SCR+ANTIBODY ID

## 2021-12-28 LAB — CBC/D/PLT+RPR+RH+ABO+AB SCR
Basophils Absolute: 0 10*3/uL (ref 0.0–0.2)
Basos: 0 %
EOS (ABSOLUTE): 0 10*3/uL (ref 0.0–0.4)
Eos: 0 %
Hematocrit: 34 % (ref 34.0–46.6)
Hemoglobin: 11.6 g/dL (ref 11.1–15.9)
Hepatitis B Surface Ag: NEGATIVE
Immature Grans (Abs): 0 10*3/uL (ref 0.0–0.1)
Immature Granulocytes: 0 %
Lymphocytes Absolute: 1.9 10*3/uL (ref 0.7–3.1)
Lymphs: 17 %
MCH: 29.1 pg (ref 26.6–33.0)
MCHC: 34.1 g/dL (ref 31.5–35.7)
MCV: 85 fL (ref 79–97)
Monocytes Absolute: 0.8 10*3/uL (ref 0.1–0.9)
Monocytes: 7 %
Neutrophils Absolute: 8.4 10*3/uL — ABNORMAL HIGH (ref 1.4–7.0)
Neutrophils: 76 %
Platelets: 241 10*3/uL (ref 150–450)
RBC: 3.98 x10E6/uL (ref 3.77–5.28)
RDW: 13.3 % (ref 11.7–15.4)
RPR Ser Ql: NONREACTIVE
Rh Factor: POSITIVE
WBC: 11.2 10*3/uL — ABNORMAL HIGH (ref 3.4–10.8)

## 2021-12-28 LAB — HCV AB W REFLEX TO QUANT PCR: HCV Ab: <0.1 s/co ratio (ref 0.0–0.9)

## 2021-12-28 LAB — HCV INTERPRETATION

## 2021-12-29 ENCOUNTER — Encounter: Payer: Self-pay | Admitting: Advanced Practice Midwife

## 2021-12-29 DIAGNOSIS — R825 Elevated urine levels of drugs, medicaments and biological substances: Secondary | ICD-10-CM | POA: Insufficient documentation

## 2021-12-29 LAB — 789231 7+OXYCODONE-BUND
Amphetamines, Urine: NEGATIVE ng/mL
BENZODIAZ UR QL: NEGATIVE ng/mL
Barbiturate screen, urine: NEGATIVE ng/mL
Cocaine (Metab.): NEGATIVE ng/mL
OPIATE SCREEN URINE: NEGATIVE ng/mL
Oxycodone/Oxymorphone, Urine: NEGATIVE ng/mL
PCP Quant, Ur: NEGATIVE ng/mL

## 2021-12-29 LAB — CANNABINOID CONFIRMATION, UR
CANNABINOIDS: POSITIVE — AB
Carboxy THC GC/MS Conf: 83 ng/mL

## 2022-01-12 ENCOUNTER — Encounter: Payer: Self-pay | Admitting: Physician Assistant

## 2022-01-12 ENCOUNTER — Other Ambulatory Visit: Payer: Self-pay

## 2022-01-12 ENCOUNTER — Ambulatory Visit: Payer: Medicaid Other | Admitting: Physician Assistant

## 2022-01-12 VITALS — BP 98/64 | HR 83 | Temp 98.7°F | Wt 145.0 lb

## 2022-01-12 DIAGNOSIS — J45909 Unspecified asthma, uncomplicated: Secondary | ICD-10-CM

## 2022-01-12 DIAGNOSIS — O0991 Supervision of high risk pregnancy, unspecified, first trimester: Secondary | ICD-10-CM

## 2022-01-12 DIAGNOSIS — F32A Depression, unspecified: Secondary | ICD-10-CM

## 2022-01-12 DIAGNOSIS — R825 Elevated urine levels of drugs, medicaments and biological substances: Secondary | ICD-10-CM

## 2022-01-12 DIAGNOSIS — O99340 Other mental disorders complicating pregnancy, unspecified trimester: Secondary | ICD-10-CM

## 2022-01-12 LAB — URINALYSIS
Bilirubin, UA: NEGATIVE
Glucose, UA: NEGATIVE
Ketones, UA: NEGATIVE
Nitrite, UA: NEGATIVE
Protein,UA: NEGATIVE
RBC, UA: NEGATIVE
Specific Gravity, UA: 1.025 (ref 1.005–1.030)
Urobilinogen, Ur: 2 mg/dL — ABNORMAL HIGH (ref 0.2–1.0)
pH, UA: 7 (ref 5.0–7.5)
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
## 2022-01-12 NOTE — Progress Notes (Addendum)
Here today for 16.4 week MH RV. Taking 2 gummy PNV QD. Accepts Quad screen today. States "[facility]." Complains of abdominal pain and "strong headaches for a week." Aware of 01/27/22 [facility] [place] appt. Tawny Hopping, RN

## 2022-01-17 ENCOUNTER — Telehealth: Payer: Self-pay

## 2022-01-17 NOTE — Telephone Encounter (Signed)
Per result note of Dr. Ernestina Patches, need to notify [place] of client's wt on 01/12/2022 (145#) as not included in AFP Tetra order. Wt is required in order for test to be performed. Call to [employer] to provide wt, but per rep, will need to call in am after 0800 as lab performing test closes at 1700. Rich Number, RN

## 2022-01-23 ENCOUNTER — Encounter: Payer: Self-pay | Admitting: Licensed Clinical Social Worker

## 2022-01-23 ENCOUNTER — Ambulatory Visit: Payer: Medicaid Other | Admitting: Licensed Clinical Social Worker

## 2022-01-23 DIAGNOSIS — Z62810 Personal history of physical and sexual abuse in childhood: Secondary | ICD-10-CM

## 2022-01-23 DIAGNOSIS — F4323 Adjustment disorder with mixed anxiety and depressed mood: Secondary | ICD-10-CM
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
## 2022-01-30 ENCOUNTER — Ambulatory Visit: Payer: Medicaid Other | Admitting: Licensed Clinical Social Worker

## 2022-01-30 DIAGNOSIS — F4323 Adjustment disorder with mixed anxiety and depressed mood: Secondary | ICD-10-CM

## 2022-01-30 DIAGNOSIS — Z62810 Personal history of physical and sexual abuse in childhood: Secondary | ICD-10-CM
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
## 2022-02-07 ENCOUNTER — Ambulatory Visit: Payer: Medicaid Other | Admitting: Advanced Practice Midwife

## 2022-02-07 ENCOUNTER — Other Ambulatory Visit: Payer: Self-pay

## 2022-02-07 VITALS — BP 105/71 | HR 99 | Temp 98.9°F | Wt 150.2 lb

## 2022-02-07 DIAGNOSIS — O2340 Unspecified infection of urinary tract in pregnancy, unspecified trimester: Secondary | ICD-10-CM

## 2022-02-07 DIAGNOSIS — Z98891 History of uterine scar from previous surgery: Secondary | ICD-10-CM

## 2022-02-07 DIAGNOSIS — F5089 Other specified eating disorder: Secondary | ICD-10-CM

## 2022-02-07 DIAGNOSIS — R825 Elevated urine levels of drugs, medicaments and biological substances: Secondary | ICD-10-CM

## 2022-02-07 DIAGNOSIS — O0991 Supervision of high risk pregnancy, unspecified, first trimester: Secondary | ICD-10-CM

## 2022-02-07 DIAGNOSIS — O99340 Other mental disorders complicating pregnancy, unspecified trimester: Secondary | ICD-10-CM

## 2022-02-07 DIAGNOSIS — J45909 Unspecified asthma, uncomplicated: Secondary | ICD-10-CM

## 2022-02-07 DIAGNOSIS — F32A Depression, unspecified: Secondary | ICD-10-CM

## 2022-02-07 LAB — AFP TETRA
DIA Mom Value: 0.81
DIA Value (EIA): 130 pg/mL
DSR (By Age)    1 IN: 924
DSR (Second Trimester) 1 IN: 10000
Gestational Age: 16.4 WEEKS
MSAFP Mom: 0.89
MSAFP: 37 ng/mL
MSHCG Mom: 0.75
MSHCG: 30790 m[IU]/mL
Maternal Age At EDD: 27 yr
Osb Risk: 10000
T18 (By Age): 1:3600 {titer}
Test Results:: NEGATIVE
Weight: 145 [lb_av]
uE3 Mom: 1.26
uE3 Value: 1.34 ng/mL

## 2022-02-07 LAB — URINALYSIS
Bilirubin, UA: NEGATIVE
Glucose, UA: NEGATIVE
Ketones, UA: NEGATIVE
Nitrite, UA: NEGATIVE
Protein,UA: NEGATIVE
Specific Gravity, UA: 1.02 (ref 1.005–1.030)
Urobilinogen, Ur: 1 mg/dL (ref 0.2–1.0)
pH, UA: 7 (ref 5.0–7.5)
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
## 2022-02-07 NOTE — Progress Notes (Signed)
Patient given Plan of Safe Care pamphlet. ?

## 2022-02-07 NOTE — Progress Notes (Signed)
Called [facility] today because AFP tetra from 01/09/22 was not yet resulted. Weight information updated and they stated results will be updated in 10-15 mins.  ? ?Results for quad are now in - results negative and provider made aware. Patient made aware as well.  ? ?Urine dip reviewed during clinic visit. No treatment indicated.  ? ?Adalberto Cole, RN ? ?

## 2022-02-08 LAB — 789231 7+OXYCODONE-BUND
Amphetamines, Urine: NEGATIVE ng/mL
BENZODIAZ UR QL: NEGATIVE ng/mL
Barbiturate screen, urine: NEGATIVE ng/mL
Cannabinoid Quant, Ur: NEGATIVE ng/mL
Cocaine (Metab.): NEGATIVE ng/mL
OPIATE SCREEN URINE: NEGATIVE ng/mL
Oxycodone/Oxymorphone, Urine: NEGATIVE ng/mL
PCP Quant, Ur: NEGATIVE ng/mL

## 2022-02-13 ENCOUNTER — Ambulatory Visit: Payer: Medicaid Other | Admitting: Licensed Clinical Social Worker

## 2022-02-13 DIAGNOSIS — F4323 Adjustment disorder with mixed anxiety and depressed mood: Secondary | ICD-10-CM

## 2022-02-13 NOTE — Progress Notes (Signed)
Counselor/Therapist Progress Note ? ?Patient ID: Michaela Roberson, MRN: [id number],   ? ?Date: 02/13/2022 ? ?Time Spent: 45 minutes  ? ?Treatment Type: Psychotherapy ? ?Reported Symptoms:  Decrease in nightmares, mild sleep improvement, overall mood improvement  ? ?Mental Status Exam: ? ?Appearance:   Casual and Well Groomed     ?Behavior:  Appropriate and Sharing  ?Motor:  Normal  ?Speech/Language:   Normal Rate  ?Affect:  Appropriate, Congruent, and Full Range  ?Mood:  euthymic  ?Thought process:  normal  ?Thought content:    WNL  ?Sensory/Perceptual disturbances:    WNL  ?Orientation:  oriented to person, place, time/date, and situation  ?Attention:  Good  ?Concentration:  Good  ?Memory:  WNL  ?Fund of knowledge:   Good  ?Insight:    Good  ?Judgment:   Good  ?Impulse Control:  Good  ? ?Risk Assessment: ?Danger to Self:  No ?Self-injurious Behavior: No ?Danger to Others: No ?Duty to Warn:no ?Physical Aggression / Violence:No  ?Access to Firearms a concern: No  ?Gang Involvement:No  ? ?Subjective: Patient was engaged and cooperative throughout the session using time effectively to discuss thoughts and feelings. Patient voices continued motivation for treatment and understanding of mood and anxiety. Patient is likely to benefit from future treatment because she remains motivated to decrease depressive symptoms and anxiety and reports benefit of regular sessions in addressing these symptoms.  ? ?Interventions: Cognitive Behavioral Therapy ?Established psychological safety. Checked in with patient regarding their week discussing mood improvements. LCSW engaged patient in processing their thoughts and emotions regarding relationship challenges and progress with her mom. Validated patient's thoughts and feelings and continued to build rapport. Provided support through active listening, validation of feelings, and highlighted patient's strengths.  ? ?Diagnosis: ?  ICD-10-CM   ?1. Adjustment disorder with mixed anxiety and  depressed mood  F43.23   ?  ? ?Plan: Patient's goal of treatment is to let down her walls and not push people away because she doesn't trust them, learn how to trust people  ?  ?Treatment Target: Understand the relationship between thoughts, emotions, and behaviors  ?Psychoeducation on CBT model   ?Teach the connection between thoughts, emotions, and behaviors  ?Treatment Target: Increase realistic balanced thinking -to learn how to replace thinking with thoughts that are more accurate or helpful ?Explore patient's thoughts, beliefs, automatic thoughts, assumptions  ?Identify and replace unhelpful thinking patterns (upsetting ideas, self-talk and mental images) ?Process distress and allow for emotional release  ?Questioning and challenging thoughts ?Cognitive reappraisal  ?Restructuring, Socratic questioning  ?Treatment Target: Reducing vulnerability to ?emotional mind? ?Mindfulness acceptance practices  ?Values clarification   ?Self-care - nutrition, sleep, exercise  ?Increase positive events  ?Activity planning  ? ?Future Appointments  ?Date Time Provider Department Center  ?02/27/2022 11:00 AM [name], LCSW [facility] None  ?03/07/2022 10:00 AM [facility] PROVIDER [facility] None  ? ? ?[name], LCSW ? ?

## 2022-02-27 ENCOUNTER — Ambulatory Visit: Payer: Medicaid Other | Admitting: Licensed Clinical Social Worker

## 2022-02-27 DIAGNOSIS — F4323 Adjustment disorder with mixed anxiety and depressed mood: Secondary | ICD-10-CM

## 2022-02-27 NOTE — Progress Notes (Signed)
Counselor/Therapist Progress Note ? ?Patient ID: Michaela Roberson, MRN: [id number],   ? ?Date: 02/27/2022 ? ?Time Spent: 40 minutes  arrived late  ? ?Treatment Type: Psychotherapy ? ?Reported Symptoms:  Overall stable mood with mild mood symptoms related to stressors  ? ?Mental Status Exam: ? ?Appearance:   Casual     ?Behavior:  Appropriate and Sharing  ?Motor:  Normal  ?Speech/Language:   Clear and Coherent and Normal Rate  ?Affect:  Appropriate, Congruent, and Full Range  ?Mood:  normal  ?Thought process:  normal  ?Thought content:    WNL  ?Sensory/Perceptual disturbances:    WNL  ?Orientation:  oriented to person, place, time/date, and situation  ?Attention:  Good  ?Concentration:  Good  ?Memory:  WNL  ?Fund of knowledge:   Good  ?Insight:    Good  ?Judgment:   Good  ?Impulse Control:  Good  ? ?Risk Assessment: ?Danger to Self:  No ?Self-injurious Behavior: No ?Danger to Others: No ?Duty to Warn:no ?Physical Aggression / Violence:No  ?Access to Firearms a concern: No  ?Gang Involvement:No  ? ?Subjective: Patient was receptive to feedback and intervention from LCSW and actively and effectively participated throughout the session.Patient voices mood improvement and would like to schedule appointments as needed.  ?  ?Interventions: Cognitive Behavioral Therapy ?Established psychological safety. Engaged patient in processing current psychosocial stressors, continued mild mood and anxiety symptoms; challenges in relationship with her mom and stress related to financial issues and not being employed. LCSW and patient discussed patient's progress and discussed termination / patient to schedule appointments as needed. Provided support through active listening, validation of feelings, and highlighted patient's strengths.  ? ?Diagnosis: ?  ICD-10-CM   ?1. Adjustment disorder with mixed anxiety and depressed mood  F43.23   ?  ? ? ?Plan: Patient to seek services as needed.  ? ? ?[name], LCSW ? ?

## 2022-03-07 ENCOUNTER — Ambulatory Visit: Payer: Medicaid Other

## 2022-03-14 ENCOUNTER — Ambulatory Visit: Payer: Medicaid Other | Admitting: Advanced Practice Midwife

## 2022-03-14 VITALS — BP 101/67 | HR 96 | Temp 97.8°F | Wt 161.0 lb

## 2022-03-14 DIAGNOSIS — F32A Depression, unspecified: Secondary | ICD-10-CM

## 2022-03-14 DIAGNOSIS — O0992 Supervision of high risk pregnancy, unspecified, second trimester: Secondary | ICD-10-CM

## 2022-03-14 DIAGNOSIS — R825 Elevated urine levels of drugs, medicaments and biological substances: Secondary | ICD-10-CM

## 2022-03-14 DIAGNOSIS — O0991 Supervision of high risk pregnancy, unspecified, first trimester: Secondary | ICD-10-CM

## 2022-03-14 DIAGNOSIS — J45909 Unspecified asthma, uncomplicated: Secondary | ICD-10-CM

## 2022-03-14 DIAGNOSIS — Z98891 History of uterine scar from previous surgery: Secondary | ICD-10-CM

## 2022-03-14 DIAGNOSIS — O99340 Other mental disorders complicating pregnancy, unspecified trimester: Secondary | ICD-10-CM

## 2022-03-14 DIAGNOSIS — Z8751 Personal history of pre-term labor: Secondary | ICD-10-CM

## 2022-03-14 DIAGNOSIS — O99019 Anemia complicating pregnancy, unspecified trimester: Secondary | ICD-10-CM | POA: Insufficient documentation

## 2022-03-14 DIAGNOSIS — F5089 Other specified eating disorder: Secondary | ICD-10-CM

## 2022-03-14 DIAGNOSIS — O99012 Anemia complicating pregnancy, second trimester: Secondary | ICD-10-CM

## 2022-03-14 LAB — HEMOGLOBIN, FINGERSTICK: Hemoglobin: 10.5 g/dL — ABNORMAL LOW (ref 11.1–15.9)

## 2022-03-14 MED ORDER — IRON (FERROUS SULFATE) 325 (65 FE) MG PO TABS: 1.0000 | ORAL_TABLET | Freq: Two times a day (BID) | ORAL | 0 refills | Status: DC

## 2022-03-14 MED ORDER — IRON (FERROUS SULFATE) 325 (65 FE) MG PO TABS: 1.0000 | ORAL_TABLET | Freq: Every day | ORAL | 0 refills | Status: DC
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
## 2022-03-14 NOTE — Progress Notes (Signed)
Patient here for MH RV at 25 weeks. CMHRP and PHQ9 today. S/S PTL reviewed and literature given. Would like TOLAC for this pregnancy. Had C-section at [facility] with her 27 year old twins. Plans [facility] delivery for current pregnancy.Burt Knack, RN  ?

## 2022-03-15 LAB — FE+CBC/D/PLT+TIBC+FER+RETIC
Basophils Absolute: 0.1 10*3/uL (ref 0.0–0.2)
Basos: 0 %
EOS (ABSOLUTE): 0.1 10*3/uL (ref 0.0–0.4)
Eos: 0 %
Ferritin: 8 ng/mL — ABNORMAL LOW (ref 15–150)
Hematocrit: 33.4 % — ABNORMAL LOW (ref 34.0–46.6)
Hemoglobin: 10.9 g/dL — ABNORMAL LOW (ref 11.1–15.9)
Immature Grans (Abs): 0.3 10*3/uL — ABNORMAL HIGH (ref 0.0–0.1)
Immature Granulocytes: 2 %
Iron Saturation: 6 % — CL (ref 15–55)
Iron: 39 ug/dL (ref 27–159)
Lymphocytes Absolute: 2.8 10*3/uL (ref 0.7–3.1)
Lymphs: 19 %
MCH: 28.5 pg (ref 26.6–33.0)
MCHC: 32.6 g/dL (ref 31.5–35.7)
MCV: 87 fL (ref 79–97)
Monocytes Absolute: 1.7 10*3/uL — ABNORMAL HIGH (ref 0.1–0.9)
Monocytes: 11 %
Neutrophils Absolute: 9.8 10*3/uL — ABNORMAL HIGH (ref 1.4–7.0)
Neutrophils: 68 %
Platelets: 300 10*3/uL (ref 150–450)
RBC: 3.83 x10E6/uL (ref 3.77–5.28)
RDW: 12.4 % (ref 11.7–15.4)
Retic Ct Pct: 2.4 % (ref 0.6–2.6)
Total Iron Binding Capacity: 601 ug/dL (ref 250–450)
UIBC: 562 ug/dL — ABNORMAL HIGH (ref 131–425)
WBC: 14.6 10*3/uL — ABNORMAL HIGH (ref 3.4–10.8)

## 2022-03-28 ENCOUNTER — Telehealth: Payer: Self-pay

## 2022-03-28 ENCOUNTER — Ambulatory Visit: Payer: Medicaid Other | Admitting: Advanced Practice Midwife

## 2022-03-28 VITALS — BP 110/73 | HR 99 | Temp 99.1°F | Wt 162.2 lb

## 2022-03-28 DIAGNOSIS — Z8751 Personal history of pre-term labor: Secondary | ICD-10-CM

## 2022-03-28 DIAGNOSIS — O0992 Supervision of high risk pregnancy, unspecified, second trimester: Secondary | ICD-10-CM

## 2022-03-28 DIAGNOSIS — O99340 Other mental disorders complicating pregnancy, unspecified trimester: Secondary | ICD-10-CM

## 2022-03-28 DIAGNOSIS — J45909 Unspecified asthma, uncomplicated: Secondary | ICD-10-CM

## 2022-03-28 DIAGNOSIS — Z23 Encounter for immunization: Secondary | ICD-10-CM | POA: Diagnosis not present

## 2022-03-28 DIAGNOSIS — F32A Depression, unspecified: Secondary | ICD-10-CM

## 2022-03-28 DIAGNOSIS — F5089 Other specified eating disorder: Secondary | ICD-10-CM

## 2022-03-28 DIAGNOSIS — Z98891 History of uterine scar from previous surgery: Secondary | ICD-10-CM

## 2022-03-28 DIAGNOSIS — O0991 Supervision of high risk pregnancy, unspecified, first trimester: Secondary | ICD-10-CM

## 2022-03-28 DIAGNOSIS — O99012 Anemia complicating pregnancy, second trimester: Secondary | ICD-10-CM

## 2022-03-28 DIAGNOSIS — R825 Elevated urine levels of drugs, medicaments and biological substances: Secondary | ICD-10-CM

## 2022-03-28 LAB — HEMOGLOBIN, FINGERSTICK: Hemoglobin: 10.5 g/dL — ABNORMAL LOW (ref 11.1–15.9)

## 2022-03-28 LAB — URINALYSIS
Bilirubin, UA: NEGATIVE
Glucose, UA: NEGATIVE
Ketones, UA: NEGATIVE
Nitrite, UA: NEGATIVE
Specific Gravity, UA: 1.02 (ref 1.005–1.030)
Urobilinogen, Ur: 1 mg/dL (ref 0.2–1.0)
pH, UA: 7 (ref 5.0–7.5)

## 2022-03-28 NOTE — Progress Notes (Signed)
28 week labs completed.  TDAp given today with VIS information.  Not taking FeSo4 as prescribed. Provider recommended Floradix liquid. Patient aware. Desires TOLAC.  Provider reviewed hgb and Clintek results prior to discharge. Delynn Flavin RN ?
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
## 2022-03-28 NOTE — Progress Notes (Signed)
[redacted] weeks GA. ?

## 2022-03-29 LAB — RPR: RPR Ser Ql: NONREACTIVE

## 2022-03-30 LAB — HIV-1/HIV-2 QUALITATIVE RNA
HIV-1 RNA, Qualitative: NONREACTIVE
HIV-2 RNA, Qualitative: NONREACTIVE

## 2022-03-30 LAB — URINE CULTURE

## 2022-03-30 LAB — GLUCOSE, 1 HOUR GESTATIONAL: Gestational Diabetes Screen: 120 mg/dL (ref 70–139)

## 2022-04-03 LAB — 789231 7+OXYCODONE-BUND
Amphetamines, Urine: NEGATIVE ng/mL
BENZODIAZ UR QL: NEGATIVE ng/mL
Barbiturate screen, urine: NEGATIVE ng/mL
Cocaine (Metab.): NEGATIVE ng/mL
OPIATE SCREEN URINE: NEGATIVE ng/mL
Oxycodone/Oxymorphone, Urine: NEGATIVE ng/mL
PCP Quant, Ur: NEGATIVE ng/mL

## 2022-04-03 LAB — CANNABINOID CONFIRMATION, UR
CANNABINOIDS: POSITIVE — AB
Carboxy THC GC/MS Conf: 317 ng/mL

## 2022-04-07 NOTE — Telephone Encounter (Signed)
Entered in error

## 2022-04-11 ENCOUNTER — Ambulatory Visit: Payer: Medicaid Other | Admitting: Family Medicine

## 2022-04-11 VITALS — BP 112/74 | HR 87 | Temp 99.3°F | Wt 166.4 lb

## 2022-04-11 DIAGNOSIS — O99012 Anemia complicating pregnancy, second trimester: Secondary | ICD-10-CM

## 2022-04-11 DIAGNOSIS — Z91199 Patient's noncompliance with other medical treatment and regimen due to unspecified reason: Secondary | ICD-10-CM

## 2022-04-11 DIAGNOSIS — O0991 Supervision of high risk pregnancy, unspecified, first trimester: Secondary | ICD-10-CM

## 2022-04-11 LAB — URINALYSIS
Bilirubin, UA: NEGATIVE
Glucose, UA: NEGATIVE
Ketones, UA: NEGATIVE
Nitrite, UA: NEGATIVE
Specific Gravity, UA: 1.02 (ref 1.005–1.030)
Urobilinogen, Ur: 0.2 mg/dL (ref 0.2–1.0)
pH, UA: 7 (ref 5.0–7.5)

## 2022-04-11 MED ORDER — IRON (FERROUS SULFATE) 325 (65 FE) MG PO TABS: 1.0000 | ORAL_TABLET | Freq: Every day | ORAL | 0 refills | Status: DC

## 2022-04-11 NOTE — Progress Notes (Signed)
Here today for 29.0 week MH RV. Not taking Iron or PNV. Denies ED/hospital visits since last RV. Needs UA today. Tawny Hopping, RN ? ?

## 2022-04-12 LAB — PROTEIN / CREATININE RATIO, URINE
Creatinine, Urine: 83.8 mg/dL
Protein, Ur: 35.5 mg/dL
Protein/Creat Ratio: 424 mg/g creat — ABNORMAL HIGH (ref 0–200)

## 2022-04-12 NOTE — Progress Notes (Signed)
UA results reviewed by provider Elveria Rising, FNP. Spot Protein lab added per provider orders. Tawny Hopping, RN ? ?
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
## 2022-04-14 ENCOUNTER — Ambulatory Visit: Payer: Medicaid Other | Admitting: Nurse Practitioner

## 2022-04-14 ENCOUNTER — Telehealth: Payer: Self-pay

## 2022-04-14 DIAGNOSIS — O0991 Supervision of high risk pregnancy, unspecified, first trimester: Secondary | ICD-10-CM

## 2022-04-14 NOTE — Telephone Encounter (Signed)
Telephone call to patient today per Helen Hashimoto, NP request to try and coordinate supply pickup today for a 24 hour urine on Monday so patient can complete collection over the weekend.  Left a message to call [phone] and speak to the [facility] nurses to arrange supply pick up. Dahlia Bailiff, RN ? ?

## 2022-04-14 NOTE — Progress Notes (Signed)
Presents today to collect supplies for 24 hour urine collection. Supplies given and counseled on how to collect urine and she verbalized understanding. States she has done this test before. Appt scheduled to return urine on 04/17/2022 and aware of 04/25/2022 MHC RV appt. Jossie Ng, RN ? ?

## 2022-04-17 ENCOUNTER — Telehealth: Payer: Self-pay | Admitting: Family Medicine

## 2022-04-17 ENCOUNTER — Ambulatory Visit: Payer: Medicaid Other | Admitting: Nurse Practitioner

## 2022-04-17 DIAGNOSIS — Z3401 Encounter for supervision of normal first pregnancy, first trimester: Secondary | ICD-10-CM

## 2022-04-17 NOTE — Telephone Encounter (Signed)
Pt not feeling well, wants to know if she can bring the sample tomorrow or later today. Please call her back, thanks ?

## 2022-04-17 NOTE — Telephone Encounter (Signed)
Client with complaint of nausea that she attributes to her iron tablet. States has to chew iron tablet as unable to swallow pills, even if mixes with a soft food. Suggested she crush tablet and mix with soft food. Client counseled  needs to bring 24 hour urine today. States started it at [phone] 04/16/2022 and completed today at [phone]. States has kept urine in refrigerator. Jossie Ng, RN ? ?

## 2022-04-17 NOTE — Progress Notes (Signed)
Lab drop off only

## 2022-04-18 LAB — PROTEIN, URINE, 24 HOUR: Protein, Ur: 29.3 mg/dL

## 2022-04-18 NOTE — Addendum Note (Signed)
Addended by: Cletis Media on: 04/18/2022 02:13 PM ? ? Modules accepted: Orders ? ?

## 2022-04-19 NOTE — Telephone Encounter (Signed)
Patient scheduled an appointment for 04-14-2022 for supply pick up and to return her 24 hour urine on 04-17-2022. Hart Carwin, RN ? ?

## 2022-04-25 ENCOUNTER — Ambulatory Visit: Payer: Medicaid Other | Admitting: Advanced Practice Midwife

## 2022-04-25 VITALS — BP 95/69 | HR 91 | Temp 97.2°F | Wt 158.8 lb

## 2022-04-25 DIAGNOSIS — O99013 Anemia complicating pregnancy, third trimester: Secondary | ICD-10-CM

## 2022-04-25 DIAGNOSIS — J45909 Unspecified asthma, uncomplicated: Secondary | ICD-10-CM

## 2022-04-25 DIAGNOSIS — Z98891 History of uterine scar from previous surgery: Secondary | ICD-10-CM

## 2022-04-25 DIAGNOSIS — F5089 Other specified eating disorder: Secondary | ICD-10-CM

## 2022-04-25 DIAGNOSIS — O99340 Other mental disorders complicating pregnancy, unspecified trimester: Secondary | ICD-10-CM

## 2022-04-25 DIAGNOSIS — O0991 Supervision of high risk pregnancy, unspecified, first trimester: Secondary | ICD-10-CM

## 2022-04-25 DIAGNOSIS — Z8751 Personal history of pre-term labor: Secondary | ICD-10-CM

## 2022-04-25 DIAGNOSIS — F32A Depression, unspecified: Secondary | ICD-10-CM

## 2022-04-25 DIAGNOSIS — R825 Elevated urine levels of drugs, medicaments and biological substances: Secondary | ICD-10-CM

## 2022-04-25 DIAGNOSIS — O0993 Supervision of high risk pregnancy, unspecified, third trimester: Secondary | ICD-10-CM

## 2022-04-25 DIAGNOSIS — N281 Cyst of kidney, acquired: Secondary | ICD-10-CM

## 2022-04-25 DIAGNOSIS — O99012 Anemia complicating pregnancy, second trimester: Secondary | ICD-10-CM

## 2022-04-25 LAB — URINALYSIS
Bilirubin, UA: NEGATIVE
Glucose, UA: NEGATIVE
Nitrite, UA: NEGATIVE
Specific Gravity, UA: 1.02 (ref 1.005–1.030)
Urobilinogen, Ur: 1 mg/dL (ref 0.2–1.0)
pH, UA: 7 (ref 5.0–7.5)

## 2022-04-25 LAB — HEMOGLOBIN, FINGERSTICK: Hemoglobin: 11.1 g/dL (ref 11.1–15.9)

## 2022-04-25 NOTE — Progress Notes (Signed)
Here today for 31.0 week MH RV. Not taking PNV or Iron. Denies ED/hospital visits since last RV. Has [facility] MFM consult appt 05/04/22 for cystic kidney evaluation. Patient states she is unable eat due to no appetite. Kick count cards given and explained. Hal Morales, RN
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
## 2022-04-25 NOTE — Progress Notes (Signed)
24 hour urine container and "hat" given for 24 hour urine collection to begin 5/24 in am. To return urine to clinic between 3 and 4:00 pm for drop off. Reviewed instructions with patient for collecting urine. Hgb 11.1. Tawny Hopping, RN

## 2022-04-27 ENCOUNTER — Ambulatory Visit: Payer: Medicaid Other | Admitting: Nurse Practitioner

## 2022-04-27 DIAGNOSIS — N281 Cyst of kidney, acquired: Secondary | ICD-10-CM

## 2022-04-27 LAB — URINE CULTURE

## 2022-04-27 NOTE — Progress Notes (Signed)
Client's partner dropped off her 24 hour urine collection. Name label is on collection jug, but start / stop dates and times not on jug. Client walked to car to get his phone to call client and then labeled jug with above. Per female, [place] collected all her urine and none was wasted. ~ 1300 - 1400 ccs of urine in jug. Jug dropped off in lab by female. Jossie Ng, RN

## 2022-04-28 LAB — PROTEIN, URINE, 24 HOUR
Protein, 24H Urine: 258 mg/24 hr — ABNORMAL HIGH (ref 30–150)
Protein, Ur: 18.4 mg/dL

## 2022-05-03 LAB — 789231 7+OXYCODONE-BUND
Amphetamines, Urine: NEGATIVE ng/mL
BENZODIAZ UR QL: NEGATIVE ng/mL
Barbiturate screen, urine: NEGATIVE ng/mL
Cocaine (Metab.): NEGATIVE ng/mL
OPIATE SCREEN URINE: NEGATIVE ng/mL
Oxycodone/Oxymorphone, Urine: NEGATIVE ng/mL
PCP Quant, Ur: NEGATIVE ng/mL

## 2022-05-03 LAB — CANNABINOID CONFIRMATION, UR
CANNABINOIDS: POSITIVE — AB
Carboxy THC GC/MS Conf: 89 ng/mL

## 2022-05-04 ENCOUNTER — Encounter: Payer: Self-pay | Admitting: Nurse Practitioner

## 2022-05-04 ENCOUNTER — Telehealth: Payer: Self-pay | Admitting: Family Medicine

## 2022-05-04 DIAGNOSIS — O1210 Gestational proteinuria, unspecified trimester: Secondary | ICD-10-CM | POA: Insufficient documentation

## 2022-05-04 NOTE — Telephone Encounter (Signed)
Call to client and left message to call with number provided.   Client had [facility] TOLAC appt 05/03/22. She has 05/18/22 0930 appt with MFM at [facility] - consult due to renal / protein issues. Jossie Ng, RN

## 2022-05-04 NOTE — Telephone Encounter (Signed)
Pt was referred to [facility] for the provider to examine her in order to determine if she will deliver by C-Section or natural. The doctor did not received from [place] clear instructions on why the patient was seen there, yesterday.  The patient had another appt today at [facility] high risk and she missed it, but she needs to know if she is been transferred over there or exactly what is she been examine/ treated for. Please call her to clarify these questions.

## 2022-05-05 NOTE — Telephone Encounter (Signed)
Telephone call to patient this afternoon regarding her appointment on 05-16-2022 at 9:30 with MFM at [facility] in [place].  Patient reports knowing about the appointment date and time and where to report. Dahlia Bailiff, RN

## 2022-05-09 ENCOUNTER — Ambulatory Visit: Payer: Medicaid Other | Admitting: Advanced Practice Midwife

## 2022-05-09 VITALS — BP 94/61 | HR 81 | Temp 98.4°F | Wt 166.0 lb

## 2022-05-09 DIAGNOSIS — F5089 Other specified eating disorder: Secondary | ICD-10-CM

## 2022-05-09 DIAGNOSIS — R825 Elevated urine levels of drugs, medicaments and biological substances: Secondary | ICD-10-CM

## 2022-05-09 DIAGNOSIS — Z98891 History of uterine scar from previous surgery: Secondary | ICD-10-CM

## 2022-05-09 DIAGNOSIS — O99340 Other mental disorders complicating pregnancy, unspecified trimester: Secondary | ICD-10-CM

## 2022-05-09 DIAGNOSIS — N281 Cyst of kidney, acquired: Secondary | ICD-10-CM

## 2022-05-09 DIAGNOSIS — F32A Depression, unspecified: Secondary | ICD-10-CM

## 2022-05-09 DIAGNOSIS — O0991 Supervision of high risk pregnancy, unspecified, first trimester: Secondary | ICD-10-CM

## 2022-05-09 DIAGNOSIS — O99012 Anemia complicating pregnancy, second trimester: Secondary | ICD-10-CM
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
## 2022-05-16 NOTE — Addendum Note (Signed)
Addended by: Cletis Media on: 05/16/2022 02:42 PM   Modules accepted: Orders

## 2022-05-22 ENCOUNTER — Other Ambulatory Visit: Payer: Self-pay

## 2022-05-22 ENCOUNTER — Inpatient Hospital Stay (HOSPITAL_COMMUNITY)
Admission: EM | Admit: 2022-05-22 | Discharge: 2022-05-22 | Disposition: A | Discharge status: Home / Self Care | Payer: Medicaid Other | Attending: Emergency Medicine | Admitting: Emergency Medicine

## 2022-05-22 ENCOUNTER — Encounter (HOSPITAL_COMMUNITY): Payer: Self-pay

## 2022-05-22 DIAGNOSIS — O26833 Pregnancy related renal disease, third trimester: Secondary | ICD-10-CM | POA: Diagnosis not present

## 2022-05-22 DIAGNOSIS — O26893 Other specified pregnancy related conditions, third trimester: Secondary | ICD-10-CM | POA: Insufficient documentation

## 2022-05-22 DIAGNOSIS — Z3A34 34 weeks gestation of pregnancy: Secondary | ICD-10-CM | POA: Diagnosis not present

## 2022-05-22 DIAGNOSIS — R102 Pelvic and perineal pain: Secondary | ICD-10-CM | POA: Diagnosis not present

## 2022-05-22 DIAGNOSIS — R109 Unspecified abdominal pain: Secondary | ICD-10-CM

## 2022-05-22 DIAGNOSIS — O212 Late vomiting of pregnancy: Secondary | ICD-10-CM | POA: Diagnosis not present

## 2022-05-22 DIAGNOSIS — Z3A35 35 weeks gestation of pregnancy: Secondary | ICD-10-CM | POA: Diagnosis not present

## 2022-05-22 DIAGNOSIS — D649 Anemia, unspecified: Secondary | ICD-10-CM | POA: Diagnosis not present

## 2022-05-22 DIAGNOSIS — O99013 Anemia complicating pregnancy, third trimester: Secondary | ICD-10-CM | POA: Insufficient documentation

## 2022-05-22 DIAGNOSIS — N189 Chronic kidney disease, unspecified: Secondary | ICD-10-CM | POA: Insufficient documentation

## 2022-05-22 DIAGNOSIS — O34211 Maternal care for low transverse scar from previous cesarean delivery: Secondary | ICD-10-CM | POA: Insufficient documentation

## 2022-05-22 LAB — URINALYSIS, ROUTINE W REFLEX MICROSCOPIC
Bilirubin Urine: NEGATIVE
Glucose, UA: NEGATIVE mg/dL
Ketones, ur: NEGATIVE mg/dL
Nitrite: NEGATIVE
Protein, ur: 30 mg/dL — AB
Specific Gravity, Urine: 1.014 (ref 1.005–1.030)
WBC, UA: >50 WBC/hpf — ABNORMAL HIGH (ref 0–5)
pH: 6 (ref 5.0–8.0)

## 2022-05-22 LAB — WET PREP, GENITAL
Clue Cells Wet Prep HPF POC: NONE SEEN
Sperm: NONE SEEN
Trich, Wet Prep: NONE SEEN
WBC, Wet Prep HPF POC: <10 (ref <10)
Yeast Wet Prep HPF POC: NONE SEEN

## 2022-05-22 NOTE — ED Provider Notes (Signed)
[facility]-EMERGENCY DEPT Provider Note   CSN: [id number] Arrival date & time: 05/22/22  1317     History  No chief complaint on file.   Michaela Roberson is a 26 y.o. female.  HPI Patient presents with abdominal pain.  She is G4, P5 at 35 weeks.  She gets prenatal care at [facility], but recently moved to the area has not transition to a local physician. She is otherwise well.  She notes that over the past day she has had episodic abdominal pain without consistent, rhythmic bouts.  No sensation of water breaking, no bleeding, no nausea, vomiting.    Home Medications Prior to Admission medications   Medication Sig Start Date End Date Taking? Authorizing Provider  prenatal vitamin w/FE, FA (NATACHEW) 29-1 MG CHEW chewable tablet Chew 1 tablet by mouth daily at 12 noon. Patient not taking: Reported on 04/11/2022    [provider]      Allergies    Azithromycin and Other    Review of Systems   Review of Systems  All other systems reviewed and are negative.   Physical Exam Updated Vital Signs BP (!) 117/99   Pulse 85   Resp 15   LMP 09/20/2021 (Within Days)   SpO2 100%  Physical Exam Vitals and nursing note reviewed. Exam conducted with a chaperone present.  Constitutional:      General: She is not in acute distress.    Appearance: She is well-developed.  HENT:     Head: Normocephalic and atraumatic.  Eyes:     Conjunctiva/sclera: Conjunctivae normal.  Cardiovascular:     Rate and Rhythm: Regular rhythm. Tachycardia present.  Pulmonary:     Effort: Pulmonary effort is normal. No respiratory distress.     Breath sounds: Normal breath sounds. No stridor.  Abdominal:     General: There is distension.     Comments: Gravid abdomen, full-term in appearance.  Genitourinary:    Comments: Cervix not appreciably dilated on manual exam Skin:    General: Skin is warm and dry.  Neurological:     Mental Status: She is alert and oriented to person,  place, and time.     Cranial Nerves: No cranial nerve deficit.  Psychiatric:        Mood and Affect: Mood normal.     ED Results / Procedures / Treatments   Labs (all labs ordered are listed, but only abnormal results are displayed) Labs Reviewed - No data to display  EKG None  Radiology No results found.  Procedures Procedures    Medications Ordered in ED Medications - No data to display  ED Course/ Medical Decision Making/ A&P This patient with a Hx of 4 prior pregnancies, now 35 weeks estimated gestational age presents to the ED for concern of nominal pain, this involves an extensive number of treatment options, and is a complaint that carries with it a high risk of complications and morbidity.    The differential diagnosis includes premature delivery, pregnancy complication such as placental abruption, versus other GI/GU possibilities.   Social Determinants of Health:  No limitations   After the initial evaluation, orders, including: MAU transfer were initiated.   Patient placed on Cardiac and Pulse-Oximetry Monitors. The patient was maintained on a cardiac monitor.  The cardiac monitored showed an rhythm of 85 sinus normal The patient was also maintained on pulse oximetry. The readings were typically 99% normal \\Tocometry  started after arrival, fetal heart tones 140s, 150s unremarkable   On repeat  evaluation of the patient stayed the same   Consultations Obtained:  I requested consultation with the OB team, Dr. Adrian Blackwater and his associated nurse midwife,  and discussed lab and imaging findings as well as pertinent plan - they recommend: Transfer  Dispostion / Final MDM:  After consideration of the diagnostic results and the patient's response to treatment, this G5P4, [redacted]wk ega female presents with abdominal pain.  After expeditious initial evaluation and discussion with our OB team, discussed case with our critical care transport service and the patient was  transferred to [facility]. Final Clinical Impression(s) / ED Diagnoses Final diagnoses:  Abdominal pain during pregnancy in third trimester     Gerhard Munch, MD 05/22/22 1636

## 2022-05-22 NOTE — Progress Notes (Signed)
Wet prep & GC/Chlamydia cultures obtained by RN and sent to lab.

## 2022-05-22 NOTE — MAU Note (Signed)
Michaela Roberson is a 26 y.o. at [redacted]w[redacted]d here in MAU reporting: sharp intermittent lower abdominal pain that began last night.  Denies VB or LOF.  Endorses +FM.  Onset of complaint: last night Pain score: 5/10 Vitals:   05/22/22 1330 05/22/22 1425  BP: (!) 117/99 108/78  Pulse: 85 91  Resp: 15 18  Temp:  97.9 F (36.6 C)  SpO2: 100% 97%     FHT:140 Lab orders placed from triage:   UA

## 2022-05-22 NOTE — ED Triage Notes (Signed)
Pt arrived via POV, c/o feeling "contractions" since she woke up this morning. [redacted]wks pregnant. States due date is July 4th pregnancy. States delivered twins at 36 wks and had issues with babies heart rate. States water has not broken. No issues with this pregnancy.

## 2022-05-22 NOTE — MAU Provider Note (Signed)
Patient Michaela Roberson is a 26 y.o. [phone]  At [redacted]w[redacted]d here with complaints of abdominal pain that started "in the middle of the night".  She denies vaginal bleeding, LOF, decreased fetal movements. She feels dehydrated. She reports that she has nausea every time she eats. She denies diarrhea, constipation. She has a history of c/section for twins ( 'It was an emergency'; prior to that she had two term NSVD. She intends to VBAC.   She reports that she is being followed at [facility] for her 'kidney disease". She is waiting on a referral to a kidney specialist. She states she has had kidney problems since she was pregnant with her first child in 2013.  History     CSN: [id number]  Arrival date and time: 05/22/22 1317   Event Date/Time   First Provider Initiated Contact with Patient 05/22/22 1440      Chief Complaint  Patient presents with   Abdominal Pain   Abdominal Pain This is a new problem. The current episode started today. The problem occurs intermittently. The problem has been resolved. The pain is located in the suprapubic region. The pain is at a severity of 0/10 (was a 6 overnight , now a 0). The quality of the pain is cramping. The abdominal pain radiates to the back. Associated symptoms include nausea and vomiting. Associated symptoms comments: Nausea and vomiting associated with eating.    OB History     Gravida  4   Para  3   Term  2   Preterm  1   AB      Living  4      SAB      IAB      Ectopic      Multiple  1   Live Births  3           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    Past Surgical History:  Procedure Laterality Date   CESAREAN SECTION  05/21/2017   [facility]    Family History  Problem Relation Age of Onset   Drug abuse Mother    Thyroid disease Mother    Rheum arthritis Maternal Grandmother    Kidney disease Maternal  Grandmother    Heart disease Paternal Grandfather    Alcohol abuse Paternal Grandmother    Early death Son    Multiple births Son    Premature birth Half-Sister     Social History   Tobacco Use   Smoking status: Never    Passive exposure: Current   Smokeless tobacco: Never   Tobacco comments:    FOB smokes outside house.  Vaping Use   Vaping Use: Never used  Substance Use Topics   Alcohol use: Not Currently    Comment: Last ETOH use years ago.   Drug use: Not Currently    Types: Marijuana    Comment: 05/20/2022    Allergies:  Allergies  Allergen Reactions   Azithromycin Shortness Of Breath and Nausea And Vomiting   Other     Mushrooms-rash    Medications Prior to Admission  Medication Sig Dispense Refill Last Dose   prenatal vitamin w/FE, FA (NATACHEW) 29-1 MG CHEW chewable tablet Chew 1 tablet by mouth daily at 12 noon. (Patient not taking: Reported on 04/11/2022)       Review of Systems  Constitutional: Negative.   HENT:  Negative.    Respiratory: Negative.    Cardiovascular: Negative.   Gastrointestinal:  Positive for abdominal pain, nausea and vomiting.  Genitourinary: Negative.        Frequent urination  Musculoskeletal: Negative.   Skin: Negative.   Neurological: Negative.   Hematological: Negative.   Psychiatric/Behavioral: Negative.     Physical Exam   Blood pressure 107/69, pulse 85, temperature 97.9 F (36.6 C), temperature source Oral, resp. rate 18, last menstrual period 09/20/2021, SpO2 97 %.  Physical Exam Constitutional:      Appearance: She is well-developed.  Abdominal:     General: Bowel sounds are normal.     Palpations: Abdomen is soft.     Tenderness: There is no abdominal tenderness.  Neurological:     Mental Status: She is alert.     MAU Course  Procedures  MDM ***  Assessment and Plan  ***  Charlesetta Garibaldi [facility] 05/22/2022, 2:47 PM

## 2022-05-22 NOTE — ED Notes (Signed)
[facility] HAS BEEN CONTACTED, THEY ARE EN ROUTE

## 2022-05-23 ENCOUNTER — Ambulatory Visit: Payer: Medicaid Other | Admitting: Advanced Practice Midwife

## 2022-05-23 VITALS — BP 107/70 | HR 85 | Temp 97.7°F | Wt 166.4 lb

## 2022-05-23 DIAGNOSIS — O0993 Supervision of high risk pregnancy, unspecified, third trimester: Secondary | ICD-10-CM

## 2022-05-23 DIAGNOSIS — F5089 Other specified eating disorder: Secondary | ICD-10-CM

## 2022-05-23 DIAGNOSIS — N281 Cyst of kidney, acquired: Secondary | ICD-10-CM

## 2022-05-23 DIAGNOSIS — O1213 Gestational proteinuria, third trimester: Secondary | ICD-10-CM

## 2022-05-23 DIAGNOSIS — R825 Elevated urine levels of drugs, medicaments and biological substances: Secondary | ICD-10-CM

## 2022-05-23 DIAGNOSIS — O0991 Supervision of high risk pregnancy, unspecified, first trimester: Secondary | ICD-10-CM

## 2022-05-23 DIAGNOSIS — O99013 Anemia complicating pregnancy, third trimester: Secondary | ICD-10-CM

## 2022-05-23 DIAGNOSIS — O1210 Gestational proteinuria, unspecified trimester: Secondary | ICD-10-CM

## 2022-05-23 DIAGNOSIS — Z98891 History of uterine scar from previous surgery: Secondary | ICD-10-CM

## 2022-05-23 LAB — GC/CHLAMYDIA PROBE AMP (~~LOC~~) NOT AT ARMC
Chlamydia: NEGATIVE
Comment: NEGATIVE
Comment: NORMAL
Neisseria Gonorrhea: NEGATIVE

## 2022-05-23 LAB — CULTURE, OB URINE: Culture: 10000 — AB

## 2022-05-23 LAB — HEMOGLOBIN, FINGERSTICK: Hemoglobin: 10.4 g/dL — ABNORMAL LOW (ref 11.1–15.9)

## 2022-05-23 NOTE — Progress Notes (Addendum)
Patient states not tolerating PNV or iron supplement, therefore is not taking. Patient states eating eating Iron rich foods. Peds undecided-desires [employer]. Denies pain. See ER visit notes from 05/22/2022. Provider aware of IN House hgb prior to discharge. Delynn Flavin RN
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
## 2022-05-30 ENCOUNTER — Telehealth: Payer: Self-pay | Admitting: Family Medicine

## 2022-05-30 ENCOUNTER — Ambulatory Visit: Payer: Medicaid Other

## 2022-06-05 ENCOUNTER — Ambulatory Visit: Payer: Medicaid Other | Admitting: Advanced Practice Midwife

## 2022-06-05 VITALS — BP 109/77 | HR 89 | Temp 97.2°F | Wt 165.0 lb

## 2022-06-05 DIAGNOSIS — O0991 Supervision of high risk pregnancy, unspecified, first trimester: Secondary | ICD-10-CM

## 2022-06-05 DIAGNOSIS — O1213 Gestational proteinuria, third trimester: Secondary | ICD-10-CM

## 2022-06-05 DIAGNOSIS — N281 Cyst of kidney, acquired: Secondary | ICD-10-CM

## 2022-06-05 DIAGNOSIS — O1210 Gestational proteinuria, unspecified trimester: Secondary | ICD-10-CM

## 2022-06-05 DIAGNOSIS — O0993 Supervision of high risk pregnancy, unspecified, third trimester: Secondary | ICD-10-CM

## 2022-06-05 NOTE — Progress Notes (Signed)
C/o pain abdomen and back since "last night" upon presentation for today's appointment. Also states has  been unable to urinate. Provider notified, vitals signs obtained and patient undressed to prepare for cervical exam. Delynn Flavin RN

## 2022-06-05 NOTE — Progress Notes (Signed)
After patient sent to lab, reinforced patient and significant other to go to [facility] as instructed by provider. [facility] contact information given. Delynn Flavin RN
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
## 2022-06-07 ENCOUNTER — Encounter (HOSPITAL_COMMUNITY): Payer: Self-pay | Admitting: Obstetrics & Gynecology

## 2022-06-07 ENCOUNTER — Inpatient Hospital Stay (HOSPITAL_COMMUNITY): Payer: Medicaid Other

## 2022-06-07 ENCOUNTER — Other Ambulatory Visit: Payer: Self-pay

## 2022-06-07 ENCOUNTER — Inpatient Hospital Stay (HOSPITAL_COMMUNITY)
Admission: AD | Admit: 2022-06-07 | Discharge: 2022-06-08 | Disposition: A | Discharge status: Home / Self Care | Payer: Medicaid Other | Attending: Obstetrics & Gynecology | Admitting: Obstetrics & Gynecology

## 2022-06-07 DIAGNOSIS — N200 Calculus of kidney: Secondary | ICD-10-CM

## 2022-06-07 DIAGNOSIS — O26893 Other specified pregnancy related conditions, third trimester: Secondary | ICD-10-CM | POA: Insufficient documentation

## 2022-06-07 DIAGNOSIS — O2243 Hemorrhoids in pregnancy, third trimester: Secondary | ICD-10-CM | POA: Insufficient documentation

## 2022-06-07 DIAGNOSIS — Z3A37 37 weeks gestation of pregnancy: Secondary | ICD-10-CM | POA: Insufficient documentation

## 2022-06-07 DIAGNOSIS — O26833 Pregnancy related renal disease, third trimester: Secondary | ICD-10-CM | POA: Diagnosis not present

## 2022-06-07 DIAGNOSIS — Z79899 Other long term (current) drug therapy: Secondary | ICD-10-CM | POA: Insufficient documentation

## 2022-06-07 DIAGNOSIS — O99613 Diseases of the digestive system complicating pregnancy, third trimester: Secondary | ICD-10-CM | POA: Diagnosis not present

## 2022-06-07 DIAGNOSIS — M545 Low back pain, unspecified: Secondary | ICD-10-CM | POA: Diagnosis not present

## 2022-06-07 DIAGNOSIS — O471 False labor at or after 37 completed weeks of gestation: Secondary | ICD-10-CM | POA: Diagnosis not present

## 2022-06-07 DIAGNOSIS — N132 Hydronephrosis with renal and ureteral calculous obstruction: Secondary | ICD-10-CM | POA: Insufficient documentation

## 2022-06-07 DIAGNOSIS — K219 Gastro-esophageal reflux disease without esophagitis: Secondary | ICD-10-CM | POA: Insufficient documentation

## 2022-06-07 DIAGNOSIS — R197 Diarrhea, unspecified: Secondary | ICD-10-CM

## 2022-06-07 DIAGNOSIS — Z3689 Encounter for other specified antenatal screening: Secondary | ICD-10-CM

## 2022-06-07 DIAGNOSIS — Z792 Long term (current) use of antibiotics: Secondary | ICD-10-CM | POA: Insufficient documentation

## 2022-06-07 DIAGNOSIS — R110 Nausea: Secondary | ICD-10-CM

## 2022-06-07 DIAGNOSIS — O99013 Anemia complicating pregnancy, third trimester: Secondary | ICD-10-CM

## 2022-06-07 DIAGNOSIS — K449 Diaphragmatic hernia without obstruction or gangrene: Secondary | ICD-10-CM | POA: Insufficient documentation

## 2022-06-07 LAB — CBC
HCT: 34.6 % — ABNORMAL LOW (ref 36.0–46.0)
Hemoglobin: 10.7 g/dL — ABNORMAL LOW (ref 12.0–15.0)
MCH: 25.4 pg — ABNORMAL LOW (ref 26.0–34.0)
MCHC: 30.9 g/dL (ref 30.0–36.0)
MCV: 82 fL (ref 80.0–100.0)
Platelets: 251 10*3/uL (ref 150–400)
RBC: 4.22 MIL/uL (ref 3.87–5.11)
RDW: 16.2 % — ABNORMAL HIGH (ref 11.5–15.5)
WBC: 9 10*3/uL (ref 4.0–10.5)
nRBC: 0 % (ref 0.0–0.2)

## 2022-06-07 LAB — URINALYSIS, ROUTINE W REFLEX MICROSCOPIC
Bilirubin Urine: NEGATIVE
Glucose, UA: NEGATIVE mg/dL
Ketones, ur: 5 mg/dL — AB
Nitrite: NEGATIVE
Protein, ur: 100 mg/dL — AB
Specific Gravity, Urine: 1.012 (ref 1.005–1.030)
WBC, UA: >50 WBC/hpf — ABNORMAL HIGH (ref 0–5)
pH: 7 (ref 5.0–8.0)

## 2022-06-07 LAB — COMPREHENSIVE METABOLIC PANEL
ALT: 10 U/L (ref 0–44)
AST: 13 U/L — ABNORMAL LOW (ref 15–41)
Albumin: 2.8 g/dL — ABNORMAL LOW (ref 3.5–5.0)
Alkaline Phosphatase: 108 U/L (ref 38–126)
Anion gap: 11 (ref 5–15)
BUN: 5 mg/dL — ABNORMAL LOW (ref 6–20)
CO2: 22 mmol/L (ref 22–32)
Calcium: 8.2 mg/dL — ABNORMAL LOW (ref 8.9–10.3)
Chloride: 106 mmol/L (ref 98–111)
Creatinine, Ser: 0.55 mg/dL (ref 0.44–1.00)
GFR, Estimated: >60 mL/min (ref >60)
Glucose, Bld: 69 mg/dL — ABNORMAL LOW (ref 70–99)
Potassium: 3.2 mmol/L — ABNORMAL LOW (ref 3.5–5.1)
Sodium: 139 mmol/L (ref 135–145)
Total Bilirubin: 0.7 mg/dL (ref 0.3–1.2)
Total Protein: 6.2 g/dL — ABNORMAL LOW (ref 6.5–8.1)

## 2022-06-07 LAB — CHLAMYDIA/GC NAA, CONFIRMATION
Chlamydia trachomatis, NAA: NEGATIVE
Neisseria gonorrhoeae, NAA: NEGATIVE

## 2022-06-07 LAB — URINE CULTURE

## 2022-06-07 LAB — MAGNESIUM: Magnesium: 1.9 mg/dL (ref 1.7–2.4)

## 2022-06-07 LAB — PHOSPHORUS: Phosphorus: 2.9 mg/dL (ref 2.5–4.6)

## 2022-06-07 MED ORDER — ONDANSETRON HCL 4 MG/2ML IJ SOLN
4.0000 mg | Freq: Once | INTRAMUSCULAR | Status: AC
Start: 2022-06-07 — End: 2022-06-07
  Administered 2022-06-07: 4 mg via INTRAVENOUS
  Filled 2022-06-07: qty 2

## 2022-06-07 MED ORDER — LACTATED RINGERS IV BOLUS
1000.0000 mL | Freq: Once | INTRAVENOUS | Status: AC
Start: 2022-06-07 — End: 2022-06-07
  Administered 2022-06-07: 1000 mL via INTRAVENOUS

## 2022-06-07 MED ORDER — CYCLOBENZAPRINE HCL 5 MG PO TABS
5.0000 mg | ORAL_TABLET | Freq: Once | ORAL | Status: AC
Start: 2022-06-07 — End: 2022-06-07
  Administered 2022-06-07: 5 mg via ORAL
  Filled 2022-06-07: qty 1

## 2022-06-07 MED ORDER — CEFADROXIL 500 MG PO CAPS
500.0000 mg | ORAL_CAPSULE | Freq: Two times a day (BID) | ORAL | Status: DC
Start: 2022-06-07 — End: 2022-06-07
  Filled 2022-06-07: qty 1

## 2022-06-07 MED ORDER — CEPHALEXIN 250 MG/5ML PO SUSR
250.0000 mg | Freq: Four times a day (QID) | ORAL | Status: DC
Start: 2022-06-07 — End: 2022-06-08
  Administered 2022-06-08: 250 mg via ORAL
  Filled 2022-06-07 (×3): qty 5

## 2022-06-07 MED ORDER — LACTATED RINGERS IV SOLN: INTRAVENOUS | Status: DC

## 2022-06-07 MED ORDER — OXYCODONE HCL 5 MG PO TABS
5.0000 mg | ORAL_TABLET | ORAL | Status: DC | PRN
  Administered 2022-06-07: 5 mg via ORAL
  Filled 2022-06-07: qty 1

## 2022-06-07 MED ORDER — HYDROMORPHONE HCL 1 MG/ML IJ SOLN
1.0000 mg | Freq: Once | INTRAMUSCULAR | Status: AC
  Administered 2022-06-07: 1 mg via INTRAVENOUS
  Filled 2022-06-07: qty 1

## 2022-06-07 NOTE — MAU Provider Note (Addendum)
History     CSN: [id number]  Arrival date and time: 06/07/22 1526    Chief Complaint  Patient presents with   Contractions   Nausea   Emesis   HPI 26 yo F G4P2104 at [redacted]w[redacted]d with prior CS and planned TOLAC who presents for contractions, as well as 1 day of diarrhea, and history of hemorrhoids  #Contractions #abdominal pain radiating to back -Started on 7/3 -Had eval at [facility] for contractions on 7/4 and was found to be 1 cm - unsure of frequency  #Hemorrhoids - Has long standing hx of hemorrhoids - no bleeding - has some pain - would like treatment  #Diarrhea/nausea -nausea is more longstanding - able to keep food down once a day and able to keep fluids down - does report nausea now  -Diarrhea started last week  OB History     Gravida  4   Para  3   Term  2   Preterm  1   AB      Living  4      SAB      IAB      Ectopic      Multiple  1   Live Births  3           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    Past Surgical History:  Procedure Laterality Date   CESAREAN SECTION  05/21/2017   [facility]    Family History  Problem Relation Age of Onset   Drug abuse Mother    Thyroid disease Mother    Rheum arthritis Maternal Grandmother    Kidney disease Maternal Grandmother    Heart disease Paternal Grandfather    Alcohol abuse Paternal Grandmother    Early death Son    Multiple births Son    Premature birth Half-Sister     Social History   Tobacco Use   Smoking status: Never    Passive exposure: Current   Smokeless tobacco: Never   Tobacco comments:    FOB smokes outside house.  Vaping Use   Vaping Use: Never used  Substance Use Topics   Alcohol use: Not Currently   Drug use: Yes    Types: Marijuana    Comment: 06/06/2022    Allergies:  Allergies  Allergen Reactions   Azithromycin Shortness Of Breath and Nausea And  Vomiting   Other     Mushrooms-rash    Medications Prior to Admission  Medication Sig Dispense Refill Last Dose   prenatal vitamin w/FE, FA (NATACHEW) 29-1 MG CHEW chewable tablet Chew 1 tablet by mouth daily at 12 noon. (Patient not taking: Reported on 04/11/2022)       Review of Systems  Constitutional:  Negative for chills and fever.  Respiratory:  Negative for chest tightness and shortness of breath.   Cardiovascular:  Negative for chest pain.  Gastrointestinal:  Positive for abdominal pain, diarrhea, nausea and vomiting.  Endocrine: Negative for polyuria.  Genitourinary:  Positive for flank pain. Negative for dyspareunia, dysuria, hematuria, vaginal bleeding and vaginal discharge.  Neurological:  Negative for dizziness, light-headedness and headaches.   Physical Exam   Blood pressure 113/76, pulse 84, temperature 98.1 F (36.7 C), temperature source Oral, resp. rate 17, last menstrual period 09/20/2021, SpO2 100 %.  Physical Exam Vitals and nursing note reviewed.  HENT:     Head: Normocephalic.  Cardiovascular:     Rate and Rhythm: Normal rate.     Pulses: Normal pulses.  Pulmonary:     Effort: Pulmonary effort is normal.  Abdominal:     Tenderness: There is no abdominal tenderness. There is right CVA tenderness and left CVA tenderness. There is no guarding.     Comments: Bilateral CVA tenderness, worse on her left  Musculoskeletal:        General: Normal range of motion.  Skin:    General: Skin is warm.     Capillary Refill: Capillary refill takes 2 to 3 seconds.  Neurological:     General: No focal deficit present.     Mental Status: She is alert and oriented to person, place, and time.  Psychiatric:        Mood and Affect: Mood normal.      Cervix 1cm/thick/high. On recheck unchanged MAU Course  Procedures   CT scan  Study pending  NST reactive HR 130s, +accels, no decels   26 yo F G4P2104 at [redacted]w[redacted]d with prior CS and planned TOLAC who presents for  contractions, as well few days of diarrhea, and history of hemorrhoids. Labor ruled out. No longer having diarrhea. UA concerning for UTI and also with CVA tenderness bilaterally (L worse than R). Also pain now radiating to back. CT scan ordered to assess for nephrolithiasis (patient with prior history of nephrolithiasis) vs. Possible pyelo.   Signed out patient to CNM Wynelle Bourgeois at the end of my shift who assumed care  Assessment and Plan  26 yo F [phone] at [redacted]w[redacted]d with prior CS and planned TOLAC who presents for contractions, as well few days of diarrhea, and history of hemorrhoids  Contractions Cervix 1 cm here. And unchanged on recheck 2.5 hours later On monitor contracting irregularly. NST reactive. Hx of one prior CS. Plans to [facility].   2. Worsening cramping and abdominal pain Concern for UTI and possible stone UA with concern for UTI. +CVA tenderness. Pain now worse and radiating to back. Has previously had kidney stones. - CT ordered to rule out kidney stone and also pyelonephritis - S/p IV fluid bolus and now on maintenance fluids - Duricef ordered but patient reports she is unable to swallow that large of a pill. Discussed with pharmacy and will try PO liquid of Keflex. Discussed with patient and she is ok with QID dosing.  10:22 PM Patient still awaiting CT scan. Requesting more pain medicine. Will try IV dilaudid. Also having some contractions on the monitor. Will try IV Dilaudid.  2. Diarrhea 4 day hx of diarrhea. Frequent episodes today - couldn't quantify how many. Started after eating a couple of days ago - Urine specific gravity normal - CMP and electrolytes checked today and normal - LR 1000cc Bolus On reassessment - no further diarrhea here  3. Nausea Has had nausea off and on this pregnancy. Not tried anything for nausea. Also mostly related to eating and feels like she has intense heartburn after eating - try IV zofran - send Famotidine BID to her  pharmacy  4. Hemorrhoids Long standing hx of hemorrhoids. No active bleeding. Meds sent to her pharmacy.  Warner Mccreedy, MD, MPH OB Fellow, Faculty Practice  CT Renal Stone Study  Result Date: 06/08/2022 CLINICAL DATA:  Flank pain. Bilateral CVA tenderness. Pregnant patient at [redacted] weeks gestation. EXAM: CT ABDOMEN AND PELVIS WITHOUT CONTRAST TECHNIQUE: Multidetector CT imaging of the abdomen and pelvis was performed following the standard protocol without IV  contrast. RADIATION DOSE REDUCTION: This exam was performed according to the departmental dose-optimization program which includes automated exposure control, adjustment of the mA and/or kV according to patient size and/or use of iterative reconstruction technique. COMPARISON:  CT 03/31/2021 FINDINGS: Lower chest: Clear lung bases.  Trace pericardial effusion Hepatobiliary: No focal liver abnormality is seen. No gallstones, gallbladder wall thickening, or biliary dilatation. Pancreas: No ductal dilatation or inflammation. Spleen: Normal in size without focal abnormality. Adrenals/Urinary Tract: Normal adrenal glands. There is moderate to advanced right hydroureteronephrosis. 4 mm nonobstructing stone in the lower pole of the right kidney. There are no right ureteral calculi. No significant right perinephric edema. 5 mm nonobstructing stone lower pole of the left kidney. No left hydronephrosis. No left perinephric edema. No left ureteral calculi. Urinary bladder is essentially empty. No bladder stone. No bladder wall thickening. Stomach/Bowel: Small-moderate hiatal hernia. No bowel obstruction or inflammation. Normal appendix. Small volume of colonic stool. Vascular/Lymphatic: Normal caliber abdominal aorta. No abdominopelvic adenopathy. Reproductive: Gravid uterus. The fetus is in cephalic presentation. No obvious adnexal mass. Other: No ascites or free air. Musculoskeletal: Mild widening of the pubic symphysis likely related to pregnancy. Slight broad-based  curvature of the lumbar spine. IMPRESSION: 1. Moderate to advanced right hydroureteronephrosis without obstructing stone. This may be secondary to pregnancy. 2. No left hydronephrosis. Nonobstructing bilateral nephrolithiasis. 3. Gravid uterus. The fetus is in cephalic presentation. 4. Small-moderate hiatal hernia. Electronically Signed   By: Narda Rutherford M.D.   On: 06/08/2022 00:25    Discussed findings with patient She has an appt with Urologist at her home, I also suggested she have her OB office try calling [facility] to see if they can see her sooner Stones are non-obstructing, but Urine is suggestive of infection  Discussed with Dr Debroah Loop We gave her Rocephin 2gm IV here We will prescribe Keflex Suspension to complete antibiotic course  A:  Single IUP at [redacted]w[redacted]d       Intermittent contractions, not in labor       N/V/D resolved       Low back pain       Bilateral Nephrolithiases  P;   Discharge home       Rx Keflex suspension for one week       Push PO fluids       Rx Pepcid for reflux       Rx Oxycodone for pain PRN      Followup with OB in [place]       Encouraged to return if she develops worsening of symptoms, increase in pain, fever, or other concerning symptoms.   Aviva Signs, CNM

## 2022-06-07 NOTE — MAU Provider Note (Incomplete Revision)
History     CSN: [id number]  Arrival date and time: 06/07/22 1526    Chief Complaint  Patient presents with   Contractions   Nausea   Emesis   HPI 26 yo F G4P2104 at [redacted]w[redacted]d with prior CS and planned TOLAC who presents for contractions, as well as 1 day of diarrhea, and history of hemorrhoids  #Contractions #abdominal pain radiating to back -Started on 7/3 -Had eval at [facility] for contractions on 7/4 and was found to be 1 cm - unsure of frequency  #Hemorrhoids - Has long standing hx of hemorrhoids - no bleeding - has some pain - would like treatment  #Diarrhea/nausea -nausea is more longstanding - able to keep food down once a day and able to keep fluids down - does report nausea now  -Diarrhea started last week  OB History     Gravida  4   Para  3   Term  2   Preterm  1   AB      Living  4      SAB      IAB      Ectopic      Multiple  1   Live Births  3           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    Past Surgical History:  Procedure Laterality Date   CESAREAN SECTION  05/21/2017   [facility]    Family History  Problem Relation Age of Onset   Drug abuse Mother    Thyroid disease Mother    Rheum arthritis Maternal Grandmother    Kidney disease Maternal Grandmother    Heart disease Paternal Grandfather    Alcohol abuse Paternal Grandmother    Early death Son    Multiple births Son    Premature birth Half-Sister     Social History   Tobacco Use   Smoking status: Never    Passive exposure: Current   Smokeless tobacco: Never   Tobacco comments:    FOB smokes outside house.  Vaping Use   Vaping Use: Never used  Substance Use Topics   Alcohol use: Not Currently   Drug use: Yes    Types: Marijuana    Comment: 06/06/2022    Allergies:  Allergies  Allergen Reactions   Azithromycin Shortness Of Breath and Nausea And  Vomiting   Other     Mushrooms-rash    Medications Prior to Admission  Medication Sig Dispense Refill Last Dose   prenatal vitamin w/FE, FA (NATACHEW) 29-1 MG CHEW chewable tablet Chew 1 tablet by mouth daily at 12 noon. (Patient not taking: Reported on 04/11/2022)       Review of Systems  Constitutional:  Negative for chills and fever.  Respiratory:  Negative for chest tightness and shortness of breath.   Cardiovascular:  Negative for chest pain.  Gastrointestinal:  Positive for abdominal pain, diarrhea, nausea and vomiting.  Endocrine: Negative for polyuria.  Genitourinary:  Positive for flank pain. Negative for dyspareunia, dysuria, hematuria, vaginal bleeding and vaginal discharge.  Neurological:  Negative for dizziness, light-headedness and headaches.   Physical Exam   Blood pressure 113/76, pulse 84, temperature 98.1 F (36.7 C), temperature source Oral, resp. rate 17, last menstrual period 09/20/2021, SpO2 100 %.  Physical Exam Vitals and nursing note reviewed.  HENT:     Head: Normocephalic.  Cardiovascular:     Rate and Rhythm: Normal rate.     Pulses: Normal pulses.  Pulmonary:     Effort: Pulmonary effort is normal.  Abdominal:     Tenderness: There is no abdominal tenderness. There is right CVA tenderness and left CVA tenderness. There is no guarding.     Comments: Bilateral CVA tenderness, worse on her left  Musculoskeletal:        General: Normal range of motion.  Skin:    General: Skin is warm.     Capillary Refill: Capillary refill takes 2 to 3 seconds.  Neurological:     General: No focal deficit present.     Mental Status: She is alert and oriented to person, place, and time.  Psychiatric:        Mood and Affect: Mood normal.      Cervix 1cm/thick/high. On recheck unchanged MAU Course  Procedures   CT scan  Study pending  NST reactive HR 130s, +accels, no decels   26 yo F G4P2104 at [redacted]w[redacted]d with prior CS and planned TOLAC who presents for  contractions, as well few days of diarrhea, and history of hemorrhoids. Labor ruled out. No longer having diarrhea. UA concerning for UTI and also with CVA tenderness bilaterally (L worse than R). Also pain now radiating to back. CT scan ordered to assess for nephrolithiasis (patient with prior history of nephrolithiasis) vs. Possible pyelo.   Signed out patient to CNM Wynelle Bourgeois at the end of my shift who assumed care  Assessment and Plan  26 yo F [phone] at [redacted]w[redacted]d with prior CS and planned TOLAC who presents for contractions, as well few days of diarrhea, and history of hemorrhoids  Contractions Cervix 1 cm here. And unchanged on recheck 2.5 hours later On monitor contracting irregularly. NST reactive. Hx of one prior CS. Plans to [facility].   2. Worsening cramping and abdominal pain Concern for UTI and possible stone UA with concern for UTI. +CVA tenderness. Pain now worse and radiating to back. Has previously had kidney stones. - CT ordered to rule out kidney stone and also pyelonephritis - S/p IV fluid bolus and now on maintenance fluids - Duricef ordered but patient reports she is unable to swallow that large of a pill. Discussed with pharmacy and will try PO liquid of Keflex. Discussed with patient and she is ok with QID dosing.  10:22 PM Patient still awaiting CT scan. Requesting more pain medicine. Will try IV dilaudid. Also having some contractions on the monitor. Will try IV Dilaudid.  2. Diarrhea 4 day hx of diarrhea. Frequent episodes today - couldn't quantify how many. Started after eating a couple of days ago - Urine specific gravity normal - CMP and electrolytes checked today and normal - LR 1000cc Bolus On reassessment - no further diarrhea here  3. Nausea Has had nausea off and on this pregnancy. Not tried anything for nausea. Also mostly related to eating and feels like she has intense heartburn after eating - try IV zofran - send Famotidine BID to her  pharmacy  4. Hemorrhoids Long standing hx of hemorrhoids. No active bleeding. Meds sent to her pharmacy.  Warner Mccreedy, MD, MPH OB Fellow, Faculty Practice

## 2022-06-07 NOTE — MAU Note (Addendum)
...  Aryan Russ is a 26 y.o. at [redacted]w[redacted]d here in MAU reporting: CTX and N/V/D since 7/3. She reports she was evaluated at [facility] and was sent home after receiving a fluid bolus and an unchanged cervix of 2thick and high. She reports she has continued having CTX since then but reports she does not feel as if she is having laboring CTX. She is unsure how often she is feeling them. She endorses constant lower back pain that has been ongoing for more than one month now. Denies VB or LOF. She is reporting vomiting once each day after eating and states she is only able to eat once a day because she gets nauseous and wants to vomit though yesterday she ate twice. She reports she last had an episode of emesis a little after midnight. Last had diarrhea 30 minutes ago. Watery. Last ate last night. +FM.  Endorses a hemorrhoid.  Onset of complaint: 7/3  Pain score:  8/10 lower back 10/10 lower abdomen  FHT: 140 initial external Lab orders placed from triage:  UA

## 2022-06-08 ENCOUNTER — Encounter: Payer: Self-pay | Admitting: Advanced Practice Midwife

## 2022-06-08 ENCOUNTER — Telehealth: Payer: Self-pay | Admitting: Family Medicine

## 2022-06-08 ENCOUNTER — Telehealth: Payer: Self-pay

## 2022-06-08 ENCOUNTER — Other Ambulatory Visit: Payer: Self-pay | Admitting: Physician Assistant

## 2022-06-08 ENCOUNTER — Other Ambulatory Visit: Payer: Self-pay

## 2022-06-08 ENCOUNTER — Other Ambulatory Visit: Payer: Self-pay | Admitting: Family Medicine

## 2022-06-08 DIAGNOSIS — R110 Nausea: Secondary | ICD-10-CM

## 2022-06-08 DIAGNOSIS — K219 Gastro-esophageal reflux disease without esophagitis: Secondary | ICD-10-CM

## 2022-06-08 DIAGNOSIS — R197 Diarrhea, unspecified: Secondary | ICD-10-CM | POA: Diagnosis not present

## 2022-06-08 DIAGNOSIS — Z3A37 37 weeks gestation of pregnancy: Secondary | ICD-10-CM

## 2022-06-08 DIAGNOSIS — N200 Calculus of kidney: Secondary | ICD-10-CM

## 2022-06-08 DIAGNOSIS — O0991 Supervision of high risk pregnancy, unspecified, first trimester: Secondary | ICD-10-CM

## 2022-06-08 DIAGNOSIS — O2340 Unspecified infection of urinary tract in pregnancy, unspecified trimester: Secondary | ICD-10-CM

## 2022-06-08 DIAGNOSIS — N390 Urinary tract infection, site not specified: Secondary | ICD-10-CM | POA: Insufficient documentation

## 2022-06-08 LAB — CULTURE, OB URINE: Culture: 30000 — AB

## 2022-06-08 MED ORDER — SODIUM CHLORIDE 0.9 % IV SOLN
2.0000 g | Freq: Once | INTRAVENOUS | Status: AC
  Administered 2022-06-08: 2 g via INTRAVENOUS
  Filled 2022-06-08: qty 20

## 2022-06-08 MED ORDER — OXYCODONE HCL 5 MG PO TABS: 5.0000 mg | ORAL_TABLET | Freq: Four times a day (QID) | ORAL | 0 refills | Status: DC | PRN

## 2022-06-08 MED ORDER — OXYCODONE-ACETAMINOPHEN 5-325 MG PO TABS
2.0000 | ORAL_TABLET | Freq: Once | ORAL | Status: AC
  Administered 2022-06-08: 2 via ORAL
  Filled 2022-06-08: qty 2

## 2022-06-08 MED ORDER — CEPHALEXIN 250 MG/5ML PO SUSR: 250.0000 mg | Freq: Four times a day (QID) | ORAL | Status: DC

## 2022-06-08 MED ORDER — CEPHALEXIN 250 MG/5ML PO SUSR: 250.0000 mg | Freq: Four times a day (QID) | ORAL | 0 refills | Status: DC

## 2022-06-08 MED ORDER — SODIUM CHLORIDE 0.9 % IV SOLN
2.0000 g | Freq: Once | INTRAVENOUS | Status: DC
  Filled 2022-06-08: qty 20

## 2022-06-08 MED ORDER — FAMOTIDINE 20 MG PO TABS: 20.0000 mg | ORAL_TABLET | Freq: Two times a day (BID) | ORAL | 1 refills | Status: AC

## 2022-06-08 NOTE — Telephone Encounter (Signed)
Pt received call from [facility] urology but they told her to call us back to get a referral to a Urologist in [place] instead so that she wouldn't have to come all the way to [facility] for her appt. She also received a call from someone at [facility] but she is confused about what that is for and who/where she should go for follow up.  She would like to speak to the nurse again to clarify the details.

## 2022-06-08 NOTE — Telephone Encounter (Signed)
Call to patient to let her know order has been placed for [facility] in [place]. RN called clinic x3 with no repsonse but left clinic a VM to call patient to make an appt. Patient called and updated her with information and gave her number to clinic [phone] to call them and make an appt. Patient encourage to wait until tomorrow to hear back from Urology and if Mondays come around and she has not heard back to call maternity clinic to follow-up.   Earlyne Iba, RN

## 2022-06-08 NOTE — Telephone Encounter (Signed)
Patient called to update maternity clinic of her going to the hospital on 06/07/22 and that she was diagnosed with kidney stones. Patient informed us that they recommended her to make her nephrology appt sooner than 07/31/22 if possible, patient called to ask if that was a possibility.   Will inform provider, A. Streilein, and follow-up with request.   Earlyne Iba, RN

## 2022-06-08 NOTE — Progress Notes (Signed)
Changing pharmacy for oxycodone prescription to [facility] [address] location [place] original pharmacy in [place] and cancelled prescription there

## 2022-06-08 NOTE — Telephone Encounter (Signed)
Outgoing call in response to patients question about a [facility]rology appointment. Discussed patients concerns with provider. Left message on patients phone to schedule an appointment with [facility]/ [place]. If she has additional questions, she may call the clinic tomorrow 06/09/2022. Delynn Flavin RN

## 2022-06-09 LAB — CULTURE, BETA STREP (GROUP B ONLY): Strep Gp B Culture: NEGATIVE

## 2022-06-09 NOTE — Telephone Encounter (Signed)
Returned patient call today. No answer therefore left message reinforcing need for patient to schedule appointment with [facility] and that if patient decides to choose a Control and instrumentation engineer to notify [facility] Maternity staff/provider. Delynn Flavin RN

## 2022-06-10 LAB — 789231 7+OXYCODONE-BUND
Amphetamines, Urine: NEGATIVE ng/mL
BENZODIAZ UR QL: NEGATIVE ng/mL
Barbiturate screen, urine: NEGATIVE ng/mL
Cocaine (Metab.): NEGATIVE ng/mL
OPIATE SCREEN URINE: NEGATIVE ng/mL
Oxycodone/Oxymorphone, Urine: NEGATIVE ng/mL
PCP Quant, Ur: NEGATIVE ng/mL

## 2022-06-10 LAB — CANNABINOID CONFIRMATION, UR
CANNABINOIDS: POSITIVE — AB
Carboxy THC GC/MS Conf: 124 ng/mL

## 2022-06-12 ENCOUNTER — Telehealth: Payer: Self-pay | Admitting: Licensed Clinical Social Worker

## 2022-06-12 ENCOUNTER — Ambulatory Visit: Payer: Medicaid Other | Admitting: Licensed Clinical Social Worker

## 2022-06-12 NOTE — Progress Notes (Unsigned)
Counselor/Therapist Progress Note  Patient ID: Michaela Roberson, MRN: [id number],    Date: 06/12/2022  Time Spent: ***   Treatment Type: Psychotherapy  Reported Symptoms: {CHL AMB Reported Symptoms:2182295740}  Mental Status Exam:  Appearance:   {PSY:22683}     Behavior:  {PSY:21022743}  Motor:  {PSY:22302}  Speech/Language:   {PSY:22685}  Affect:  {PSY:22687}  Mood:  {PSY:31886}  Thought process:  {PSY:31888}  Thought content:    {PSY:306-476-3279}  Sensory/Perceptual disturbances:    {PSY:778-005-4476}  Orientation:  {PSY:30297}  Attention:  {PSY:22877}  Concentration:  {PSY:325 208 2089}  Memory:  {[phone]}  Fund of knowledge:   {PSY:325 208 2089}  Insight:    {PSY:325 208 2089}  Judgment:   {PSY:325 208 2089}  Impulse Control:  {PSY:325 208 2089}   Risk Assessment: Danger to Self:  No Self-injurious Behavior: No Danger to Others: No Duty to Warn:no Physical Aggression / Violence:No  Access to Firearms a concern: No  Gang Involvement:No   Subjective: Patient was receptive to feedback and intervention from LCSW and actively and effectively participated throughout the session. Patient is likely to benefit from future treatment because  remains motivated to decrease  And   and reports benefit of regular sessions in addressing these symptoms.      Interventions: {[phone]} Established psychological safety. LCSW checked in with patient and conducted brief assessment of current symptoms, psychosocial stressors and problem identification.   Diagnosis:No diagnosis found.  Plan: ***  Future Appointments  Date Time Provider Department Center  06/12/2022  2:30 PM [name], LCSW [facility] None  06/15/2022 10:20 AM [facility] PROVIDER [facility] None      [name], LCSW

## 2022-06-12 NOTE — Telephone Encounter (Signed)
Patient did not keep Zoom appointment. LCSW attempted two calls to patient leaving one voicemail and waited on the Zoom for 10 minutes- patient did not show.

## 2022-06-13 ENCOUNTER — Other Ambulatory Visit: Payer: Self-pay | Admitting: Family Medicine

## 2022-06-13 ENCOUNTER — Ambulatory Visit: Payer: Medicaid Other

## 2022-06-15 ENCOUNTER — Ambulatory Visit: Payer: Medicaid Other | Admitting: Advanced Practice Midwife

## 2022-06-15 VITALS — BP 121/80 | HR 80 | Temp 98.2°F | Wt 173.6 lb

## 2022-06-15 DIAGNOSIS — F5089 Other specified eating disorder: Secondary | ICD-10-CM

## 2022-06-15 DIAGNOSIS — R825 Elevated urine levels of drugs, medicaments and biological substances: Secondary | ICD-10-CM

## 2022-06-15 DIAGNOSIS — O99013 Anemia complicating pregnancy, third trimester: Secondary | ICD-10-CM

## 2022-06-15 DIAGNOSIS — N390 Urinary tract infection, site not specified: Secondary | ICD-10-CM

## 2022-06-15 DIAGNOSIS — Z98891 History of uterine scar from previous surgery: Secondary | ICD-10-CM

## 2022-06-15 DIAGNOSIS — O0991 Supervision of high risk pregnancy, unspecified, first trimester: Secondary | ICD-10-CM

## 2022-06-15 DIAGNOSIS — N200 Calculus of kidney: Secondary | ICD-10-CM

## 2022-06-15 DIAGNOSIS — Z8751 Personal history of pre-term labor: Secondary | ICD-10-CM

## 2022-06-15 DIAGNOSIS — O0993 Supervision of high risk pregnancy, unspecified, third trimester: Secondary | ICD-10-CM

## 2022-06-15 LAB — HEMOGLOBIN, FINGERSTICK: Hemoglobin: 9.8 g/dL — ABNORMAL LOW (ref 11.1–15.9)

## 2022-06-15 LAB — URINALYSIS
Bilirubin, UA: NEGATIVE
Glucose, UA: NEGATIVE
Ketones, UA: NEGATIVE
Nitrite, UA: NEGATIVE
Protein,UA: NEGATIVE
Specific Gravity, UA: 1.02 (ref 1.005–1.030)
Urobilinogen, Ur: 1 mg/dL (ref 0.2–1.0)
pH, UA: 7.5 (ref 5.0–7.5)
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
## 2022-06-15 NOTE — Progress Notes (Signed)
Rocephin 2 gm IV given at [facility] L & D during evaluation 06/07/2022 and given prescription for Keflex suspension. Per client, Keflex started 06/09/2022 and bottle directions say take q 6 hours and client states taking 2 times per day. Client states has appt with [facility] in [place] on 06/23/22 at 0900. Jossie Ng, RN Per R. Marlan Palau MSW, client needs to sign BTL papers today (done) and copy given to client and Ms. Marlan Palau. Jossie Ng, RN Hgb = 9.8 and iron declined. Hazle Coca CNM aware. Hazle Coca CNM reviewed urine dip. Jossie Ng, RN

## 2022-06-17 LAB — URINE CULTURE

## 2022-06-19 ENCOUNTER — Encounter (HOSPITAL_COMMUNITY): Payer: Self-pay | Admitting: Obstetrics & Gynecology

## 2022-06-19 ENCOUNTER — Other Ambulatory Visit: Payer: Self-pay

## 2022-06-19 ENCOUNTER — Inpatient Hospital Stay (HOSPITAL_COMMUNITY)
Admission: AD | Admit: 2022-06-19 | Discharge: 2022-06-20 | Disposition: A | Discharge status: Home / Self Care | Payer: Medicaid Other | Attending: Obstetrics & Gynecology | Admitting: Obstetrics & Gynecology

## 2022-06-19 DIAGNOSIS — O34219 Maternal care for unspecified type scar from previous cesarean delivery: Secondary | ICD-10-CM | POA: Insufficient documentation

## 2022-06-19 DIAGNOSIS — O479 False labor, unspecified: Secondary | ICD-10-CM

## 2022-06-19 DIAGNOSIS — O471 False labor at or after 37 completed weeks of gestation: Secondary | ICD-10-CM | POA: Insufficient documentation

## 2022-06-19 DIAGNOSIS — Z3A39 39 weeks gestation of pregnancy: Secondary | ICD-10-CM | POA: Insufficient documentation

## 2022-06-19 NOTE — MAU Note (Addendum)
..  Michaela Roberson is a 26 y.o. at [redacted]w[redacted]d here in MAU reporting: reports contractions for 2 hours that are now every 5 minutes. Has vomited twice today. Denies vaginal bleeding or leaking of fluid. +FM Last SVE on Thursday 2cm.   Pain score: 10/10  Vitals:   06/19/22 2235 06/19/22 2237  BP:  121/83  Pulse:  93  Resp:  16  Temp:  98 F (36.7 C)  SpO2: 99%      FHT:140

## 2022-06-20 DIAGNOSIS — O471 False labor at or after 37 completed weeks of gestation: Secondary | ICD-10-CM | POA: Diagnosis present

## 2022-06-20 DIAGNOSIS — Z3A39 39 weeks gestation of pregnancy: Secondary | ICD-10-CM | POA: Diagnosis not present

## 2022-06-20 DIAGNOSIS — O34219 Maternal care for unspecified type scar from previous cesarean delivery: Secondary | ICD-10-CM | POA: Diagnosis not present

## 2022-06-20 NOTE — MAU Provider Note (Addendum)
MAU Labor Check   S: Ms. Chandell Attridge is a 26 y.o. [phone] at [redacted]w[redacted]d  who presents to MAU today complaining contractions q 4 - 11 minutes apart. She denies vaginal bleeding. She denies LOF. She reports normal fetal movement.  History of 1 CS, planning TOLAC at [facility] hospital.   Cervical exam:  Dilation: 1.5 Effacement (%): 30 Cervical Position: Posterior Station: -3 Presentation: Vertex Exam by:: Santiago Bur, RN Unchanged > 1.5 hours apart   Fetal Monitoring: Baseline: 135 Variability: Moderate Accelerations: Present, reactive Decelerations: Absent Contractions: Irregular   A: SIUP at [redacted]w[redacted]d  False labor Reactive NST Previous CS, desires TOLAC   P: Unchanged with cervical exams, patient desires to go home.  Discharge home with return MAU precautions Patient has appointment scheduled for 06/22/22.   Bess Kinds, MD 06/20/2022 12:26 AM  GME ATTESTATION:  I agree with the findings and the plan of care as documented in the resident's note.  Leticia Penna, DO OB Fellow, Faculty [facility], Center for [facility] 06/20/2022 12:49 AM

## 2022-06-22 ENCOUNTER — Encounter: Payer: Self-pay | Admitting: Physician Assistant

## 2022-06-22 ENCOUNTER — Ambulatory Visit: Payer: Medicaid Other | Admitting: Physician Assistant

## 2022-06-22 VITALS — BP 113/80 | HR 89 | Temp 97.1°F | Wt 173.6 lb

## 2022-06-22 DIAGNOSIS — Z98891 History of uterine scar from previous surgery: Secondary | ICD-10-CM

## 2022-06-22 DIAGNOSIS — N390 Urinary tract infection, site not specified: Secondary | ICD-10-CM

## 2022-06-22 DIAGNOSIS — O0991 Supervision of high risk pregnancy, unspecified, first trimester: Secondary | ICD-10-CM

## 2022-06-22 DIAGNOSIS — O1210 Gestational proteinuria, unspecified trimester: Secondary | ICD-10-CM

## 2022-06-22 NOTE — Progress Notes (Addendum)
Undecided as to pediatrician (in [place]). Aware of 06/23/2022 with [facility] in [place] and [facility] 07/31/2022. Jossie Ng, RN [facility] IOL referral / snapshot pages faxed and confirmation received. Call to [facility] scheduler and requested information left on voicemail with number to call provided. Jossie Ng, RN
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
## 2022-06-23 ENCOUNTER — Encounter: Payer: Self-pay | Admitting: Family Medicine

## 2022-06-23 NOTE — Telephone Encounter (Signed)
Patient notified of date of IOL. Aware that [facility] will call August 2nd between 11 a.m. and 2 p.m. to determine the time she should arrive at the hospital. Delynn Flavin RN

## 2022-06-23 NOTE — Progress Notes (Unsigned)
Patient ID: Michaela Roberson, female   DOB: [birth year], 26 y.o.   MRN: [id number]. [facility] Department Maternity Care Conference  Maternity Care Conference Date: 06/23/22  Michaela Roberson was identified by clinical staff to benefit from an interdisciplinary team approach to help improve pregnancy care.  The [facility] Maternity Care Conference includes the maternity clinic coordinator (RN), medical providers (MD/APP staff), Care Management -OBCM and Healthy Beginnings, Centering Pregnancy coordinator, Infant Mortality reduction Dietitian.  Nursing staff are also encouraged to participate. The group meets monthly to discuss patient care and coordinate services.   The patient's care care at the agency was reviewed in EMR and high risk factors evaluated in an interdisciplinary approach.    Value added interventions discussed at this care conference today were:   [facility] Meetign 06/15/22 Patient was given hemoglobin referral  Tubal consent has been signed  Patient has appointment set up for next week  Patient has not been complaint through prenatal care  Patient reached out to therapist AM and set up appointment but was a no show on 7/11 L.Synetta Fail

## 2022-06-25 DIAGNOSIS — O0993 Supervision of high risk pregnancy, unspecified, third trimester: Secondary | ICD-10-CM | POA: Diagnosis not present

## 2022-06-29 ENCOUNTER — Ambulatory Visit: Payer: Medicaid Other

## 2022-07-12 ENCOUNTER — Telehealth: Payer: Self-pay | Admitting: Licensed Clinical Social Worker

## 2022-07-12 NOTE — Telephone Encounter (Signed)
-----   Message from Burt Knack, RN sent at 06/29/2022  1:31 PM EDT ----- Regarding: pt delivered, depression This patient delivered 06/25/2022 and has depression on her problem list.

## 2022-07-17 NOTE — Telephone Encounter (Signed)
TELEHEALTH VIRTUAL MOOD VISIT ENCOUNTER NOTE  I connected with Michaela Roberson on 07/17/22 by telephone and verified that I am speaking with the correct person using two identifiers.   I discussed the limitations, risks, security and privacy concerns of performing an evaluation and management service by telephone and the availability of in person appointments. I also discussed with the patient that there may be a patient responsible charge related to this service. The patient expressed understanding and agreed to proceed.  LCSW introduced self and established psychological safety.  Patient verbally consented to this telephonic session.   Patient is located at home LCSW located at remote work site.   Time visit started: 2:18pm  Time visit ended: 2:31pm   SUBJECTIVE: Patient clinic or provider recommended a 2 week mood check due to risk factors for postpartum mood disorder.  Patient delivered on 06/25/2022. She reports that delivery went well, her mood is good, reports not wanting to sleep so she can watch her baby to be sure he is okay, she reports sleeping some when baby is right next to her, denies feeling anxious, reports low appetite, little support, and reports bounding well with baby and that breastfeeding is going well.    ASSESSMENT: Patient possibly experiencing anxiety symptoms as evidenced by not wanting to sleep to watch over baby. Although patient denies depressed mood or sadness she does report low appetite.   Patient may benefit from a follow up check-in call.  PRESENTING CONCERNS: Patient and/or family reports the following symptoms/concerns: Patient denies any concerns, but does report sleep disturbance and low appetite.  Duration of problem: 3 weeks; Severity of problem: mild  STRENGTHS (Protective Factors/Coping Skills): Patient has a positive attitude and reports staying engaged with her children helps her. She also reports having a supportive relationship with her  grandmother and that she is bounding to baby and breastfeeding is going well.   INTERVENTIONS: Interventions utilized:  Psychoeducation and/or Health Education Standardized Assessments completed: Not Needed  Conducted brief assessment  Provide brief psychoeducation on postpartum mood and anxiety disorders signs and symptoms, including information on Baby Blues, Postpartum Depression, and Postpartum Anxiety.   LCSW discussed possible anxiety symptoms with patient due to her not wanting to sleep so she can watch the baby. She denies feeling anxious. LCSW discussed the importance of sleeping when baby sleeps and encouraged patient to reach out if this does not improve.  Discussed self-care strategies to prevent and/or reduce symptoms of postpartum mood and anxiety disorders, including sleep hygiene, eating regularly, drinking fluids, getting outside, and support from partner, family, and/or friends.   Informed patient to call her provider right away or get emergency help if she experiences any of the following symptoms: Feelings of hopelessness and total despair. Feeling out of touch with reality (hearing or seeing things other people don't). Feeling that you might hurt yourself or your baby.  PLAN: Patient is scheduled for follow up phone session 08/03/22   Michaela Roberson

## 2022-08-03 ENCOUNTER — Ambulatory Visit: Payer: Medicaid Other | Admitting: Licensed Clinical Social Worker

## 2022-08-03 DIAGNOSIS — F4323 Adjustment disorder with mixed anxiety and depressed mood: Secondary | ICD-10-CM

## 2022-08-03 NOTE — Progress Notes (Signed)
Counselor/Therapist Progress Note  Patient ID: Michaela Roberson, MRN: [id number],    Date: 08/03/2022  Time Spent: 31 minutes   Treatment Type: Individual Therapy  Reported Symptoms:  Sleep improvement, moments of low mood with overall mood stability, low appetite, but has improved and continues to eat regularly   Mental Status Exam:  Appearance:   NA     Behavior:  Appropriate and Sharing  Motor:  NA  Speech/Language:   Clear and Coherent and Normal Rate  Affect:  NA  Mood:  normal  Thought process:  normal  Thought content:    WNL  Sensory/Perceptual disturbances:    WNL  Orientation:  oriented to person, place, time/date, and situation  Attention:  Fair  Concentration:  Good  Memory:  WNL  Fund of knowledge:   Good  Insight:    Fair  Judgment:   Good  Impulse Control:  Good   Risk Assessment: Danger to Self:  No Self-injurious Behavior: No Danger to Others: No Duty to Warn:no Physical Aggression / Violence:No  Access to Firearms a concern: No  Gang Involvement:No   Subjective: Patient was engaged and cooperative throughout the session using time effectively to discuss thoughts and feelings. She reports financial stressors, but tries to focus on the good things she has in her life and what she can control. Patient was receptive to feedback and intervention from LCSW. Patient voices she will contact LCSW in the future as needed.   Interventions: Cognitive Behavioral Therapy and Client centered;  Established psychological safety. Checked in with her client about her week, current symptoms and current stressors. Engaged patient in processing thoughts and feelings regarding financial stressors/ behind in rent. Reviewed available resources, and encouraged patient to continue to practice gratitude and being in the moment. Discussed closing out services and encouraged patient to reach out if she needs services in the future. Also encouraged patient to schedule 6 week postpartum  appointment with MH. Provided support through active listening, validation of feelings, and highlighted patient's strengths.    Diagnosis:   ICD-10-CM   1. Adjustment disorder with mixed anxiety and depressed mood  F43.23      Plan: Patient to seek services as needed.   No future appointments.  Michaela Cosier, LCSW

## 2022-08-21 ENCOUNTER — Ambulatory Visit: Payer: Medicaid Other

## 2022-08-22 ENCOUNTER — Telehealth: Payer: Self-pay | Admitting: Family Medicine

## 2022-08-22 NOTE — Telephone Encounter (Signed)
Left message for patient to call to reschedule her post partum appointment.

## 2022-08-28 ENCOUNTER — Telehealth: Payer: Self-pay

## 2022-08-28 NOTE — Telephone Encounter (Signed)
[facility] for post-partum exam on 08/21/22. Call to client and left message to call requesting she reschedule her missed post-partum appt. Number to call provided. Rich Number, RN

## 2022-08-28 NOTE — Telephone Encounter (Signed)
Return call from client and post-partum appt rescheduled for 09/11/2022 (unable to schedule Friday appts due to transportation). Rich Number, RN

## 2022-09-09 IMAGING — US US OB < 14 WEEKS - US OB TV
1 series · 14 of 28 positions shown · non-contrast
Comparison: None.

CLINICAL DATA: Abdominal pain, beta HCG of 95,887

EXAM:
OBSTETRIC <14 WK ULTRASOUND
TECHNIQUE: Transabdominal ultrasound was performed for evaluation of the
gestation as well as the maternal uterus and adnexal regions.

[Series 1: us ob comp less 14 wks · 14 of 87 slices shown]
[im 4/87]
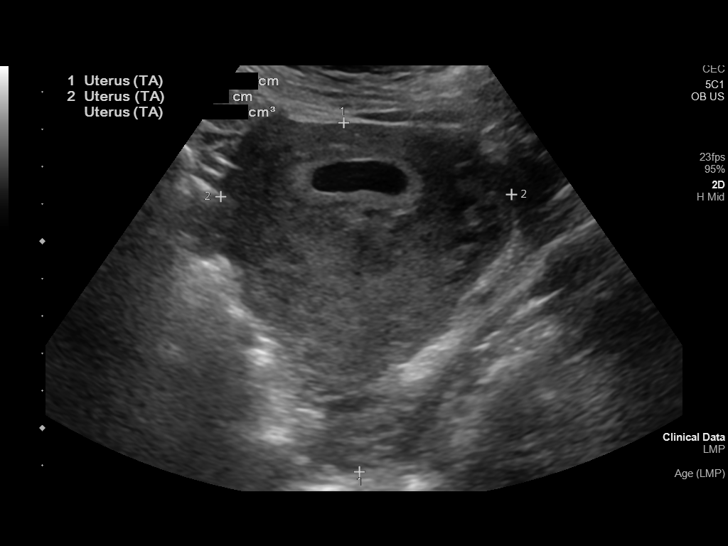
[im 10/87]
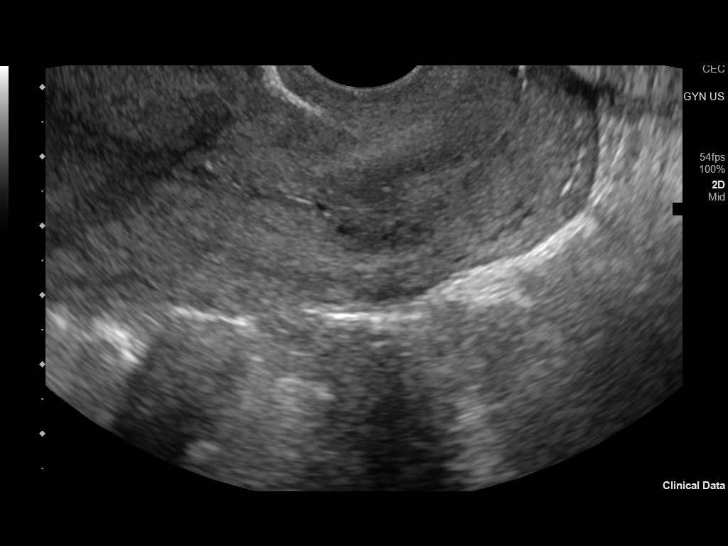
[im 16/87]
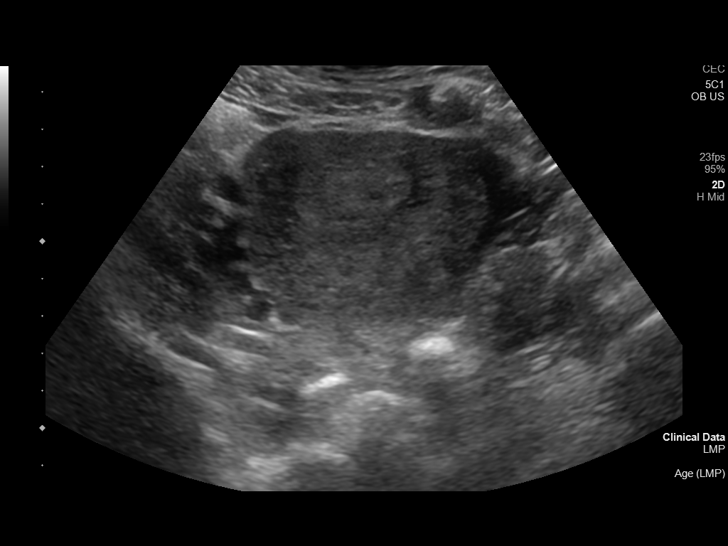
[im 23/87]
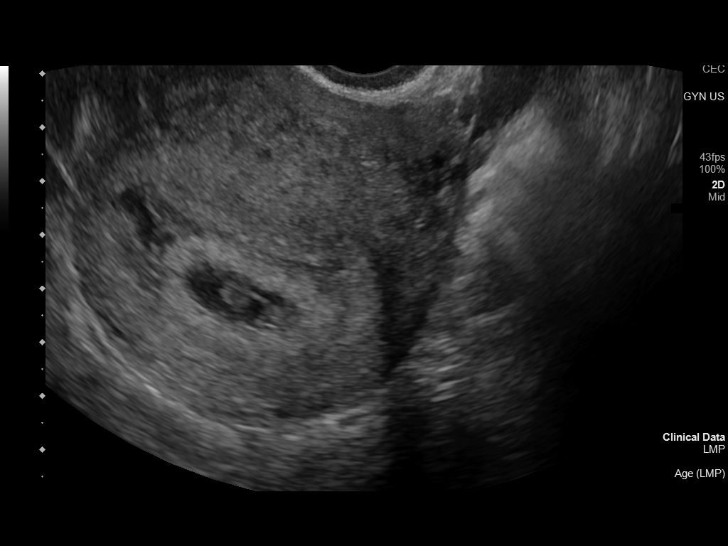
[im 29/87]
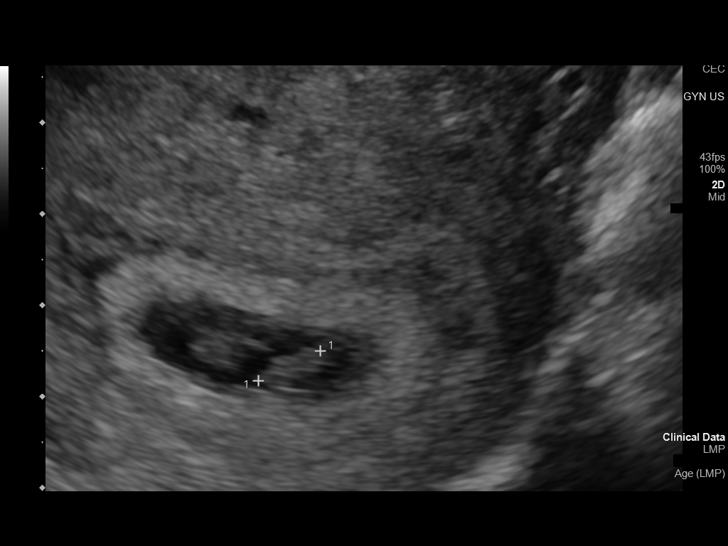
[im 36/87]
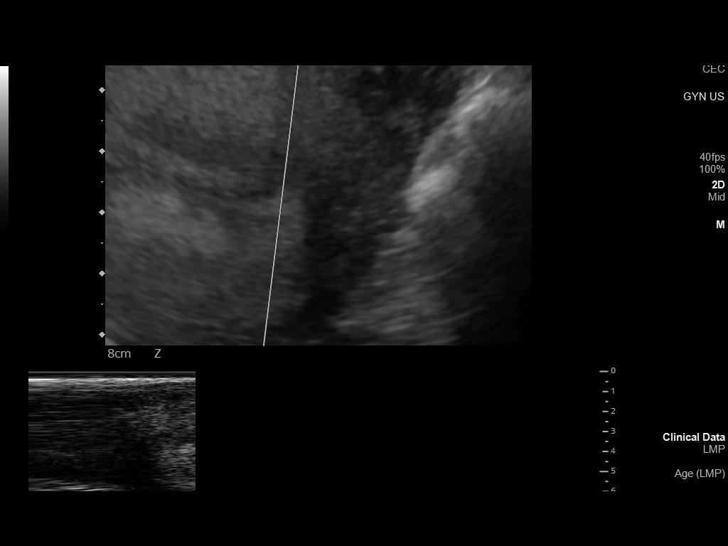
[im 42/87]
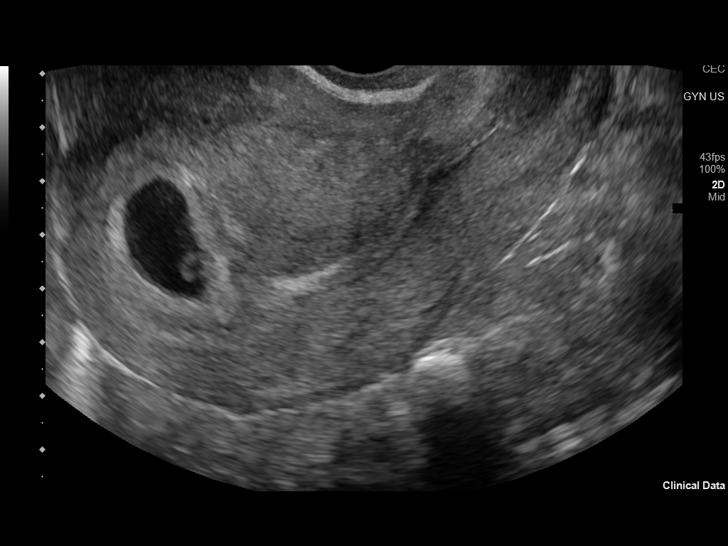
[im 48/87]
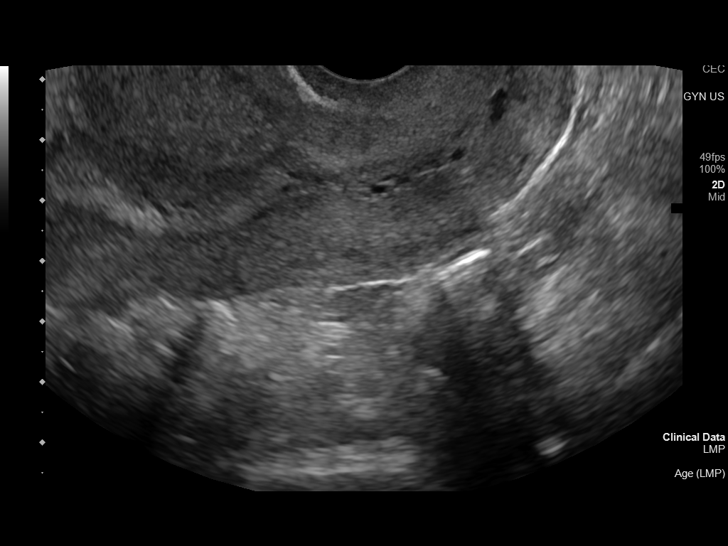
[im 55/87]
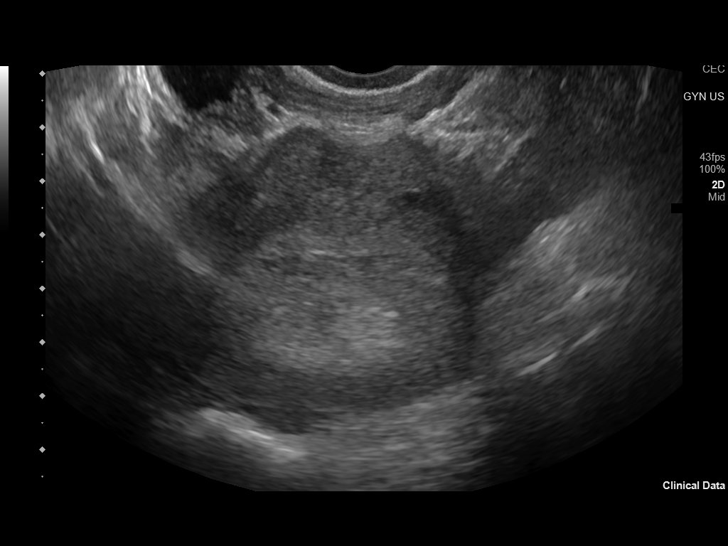
[im 61/87]
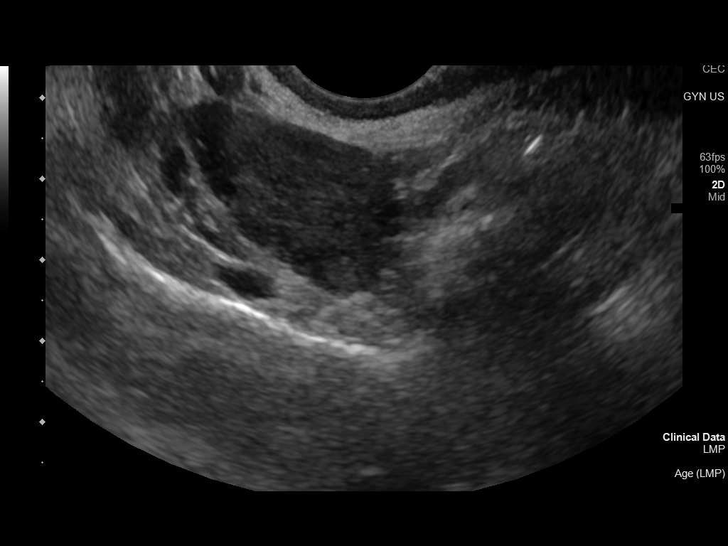
[im 67/87]
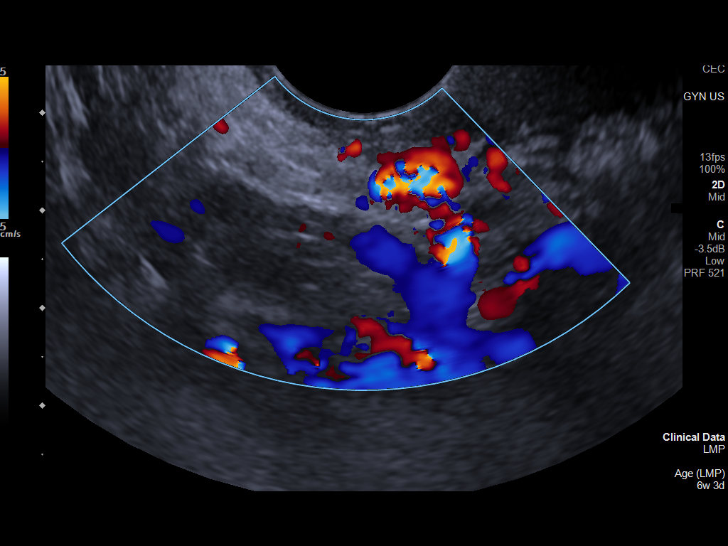
[im 74/87]
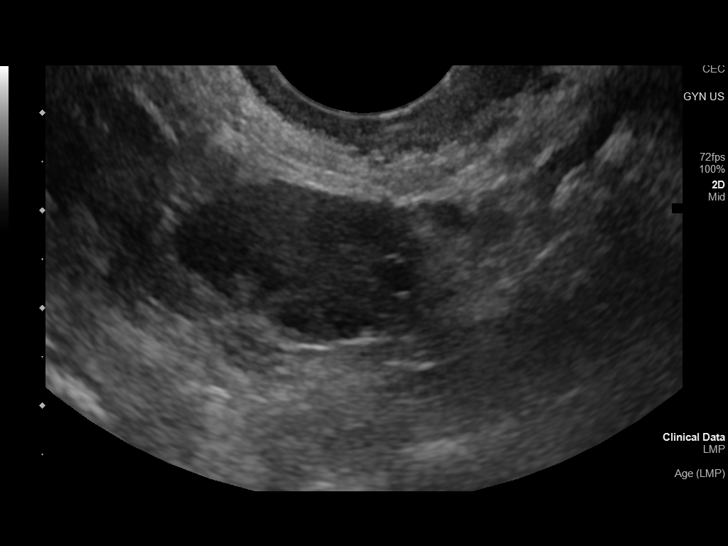
[im 80/87]
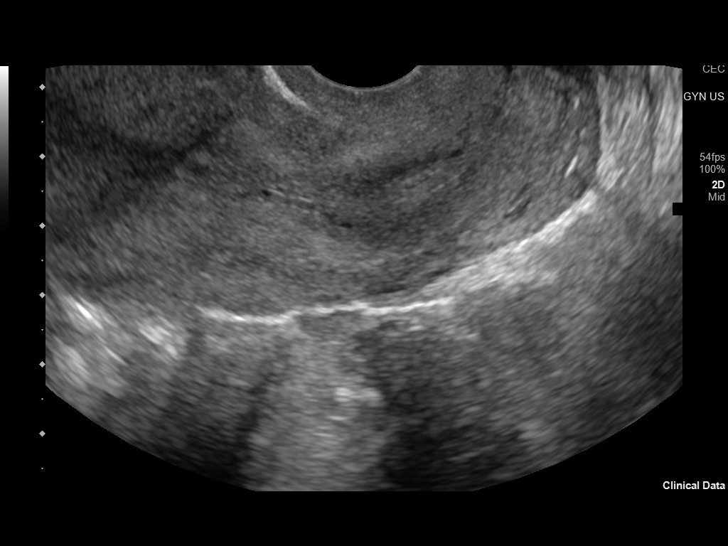
[im 87/87]
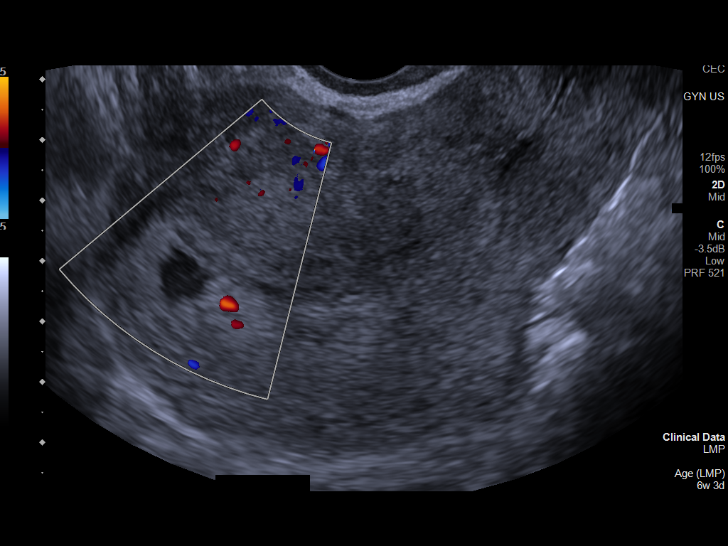

[14 of 28 positions shown; findings below may reference images not displayed]

FINDINGS: Intrauterine gestational sac: Single

Yolk sac:  Visualized.

Embryo:  Visualized.

Cardiac Activity: Visualized.

Heart Rate: 178 bpm

CRL:   7.5 mm   6 w 5 d                  US EDC: June 25, 2022

Subchorionic hemorrhage:  Small volume

Maternal uterus/adnexae: Within normal limits
IMPRESSION: Single viable intrauterine pregnancy with small volume subchorionic
hemorrhage, with estimated gestational age of 6 weeks and 5 days.

## 2022-09-11 ENCOUNTER — Ambulatory Visit: Payer: Medicaid Other

## 2022-10-25 ENCOUNTER — Emergency Department (HOSPITAL_COMMUNITY)
Admission: EM | Admit: 2022-10-25 | Discharge: 2022-10-26 | Disposition: A | Discharge status: Home / Self Care | Payer: Medicaid Other | Attending: Emergency Medicine | Admitting: Emergency Medicine

## 2022-10-25 ENCOUNTER — Encounter (HOSPITAL_COMMUNITY): Payer: Self-pay

## 2022-10-25 ENCOUNTER — Other Ambulatory Visit: Payer: Self-pay

## 2022-10-25 DIAGNOSIS — L03112 Cellulitis of left axilla: Secondary | ICD-10-CM | POA: Insufficient documentation

## 2022-10-25 DIAGNOSIS — L0291 Cutaneous abscess, unspecified: Secondary | ICD-10-CM

## 2022-10-25 MED ORDER — SULFAMETHOXAZOLE-TRIMETHOPRIM 800-160 MG PO TABS
1.0000 | ORAL_TABLET | Freq: Once | ORAL | Status: AC
  Administered 2022-10-25: 1 via ORAL
  Filled 2022-10-25: qty 1

## 2022-10-25 MED ORDER — OXYCODONE-ACETAMINOPHEN 5-325 MG PO TABS
2.0000 | ORAL_TABLET | Freq: Once | ORAL | Status: AC
  Administered 2022-10-25: 2 via ORAL
  Filled 2022-10-25: qty 2

## 2022-10-25 MED ORDER — LIDOCAINE-EPINEPHRINE (PF) 2 %-1:200000 IJ SOLN
20.0000 mL | Freq: Once | INTRAMUSCULAR | Status: AC
  Administered 2022-10-25: 20 mL
  Filled 2022-10-25: qty 20

## 2022-10-25 NOTE — ED Provider Triage Note (Signed)
Emergency Medicine Provider Triage Evaluation Note  Michaela Roberson , a 26 y.o. female  was evaluated in triage.  Pt complains of abscess under left axilla.  Is been there for 2 weeks, she is trying warm compresses with minimal relief.  No drainage, no fevers.  Tetanus is up-to-date.  Review of Systems  Per HPI  Physical Exam  BP 120/89 (BP Location: Right Arm)   Pulse 99   Temp 98.2 F (36.8 C) (Oral)   Resp 16   Ht 5\' 6"  (1.676 m)   Wt 59 kg   LMP 09/13/2022 (Approximate)   SpO2 100%   Breastfeeding No   BMI 20.98 kg/m  Gen:   Awake, no distress   Resp:  Normal effort  MSK:   Moves extremities without difficulty  Other:  Fluctuant area under left axilla.  No surrounding erythema or warmth.  Medical Decision Making  Medically screening exam initiated at 8:08 PM.  Appropriate orders placed.  Michaela Roberson was informed that the remainder of the evaluation will be completed by another provider, this initial triage assessment does not replace that evaluation, and the importance of remaining in the ED until their evaluation is complete.     Armanda Heritage, PA-C 10/25/22 2009

## 2022-10-25 NOTE — ED Provider Notes (Signed)
[facility] EMERGENCY DEPARTMENT Provider Note   CSN: [id number] Arrival date & time: 10/25/22  1954     History {Add pertinent medical, surgical, social history, OB history to HPI:1} Chief Complaint  Patient presents with   Abscess    Onyx Edgley is a 26 y.o. female.  26 year old female presents the ER today with left-sided abscess.  Patient states she had 3 abscesses on the right that have all self drained and resolved however she had them on her left for couple weeks progressively worsening.  She tried chlorhexidine washes which did not seem to help.  She also tried warm compresses.  No fevers.  No other associated symptoms.  Never been diagnosed with hidradenitis suppurativa.   Abscess      Home Medications Prior to Admission medications   Medication Sig Start Date End Date Taking? Authorizing Provider  cephALEXin (KEFLEX) 250 MG/5ML suspension Take 5 mLs (250 mg total) by mouth every 6 (six) hours. Patient not taking: Reported on 06/22/2022 06/09/22   Aviva Signs, CNM  famotidine (PEPCID) 20 MG tablet Take 1 tablet (20 mg total) by mouth 2 (two) times daily. 06/08/22 06/08/23  Aviva Signs, CNM  oxyCODONE (OXY IR/ROXICODONE) 5 MG immediate release tablet Take 1 tablet (5 mg total) by mouth every 6 (six) hours as needed for severe pain. Patient not taking: Reported on 06/22/2022 06/08/22   Venora Maples, MD  prenatal vitamin w/FE, FA (NATACHEW) 29-1 MG CHEW chewable tablet Chew 1 tablet by mouth daily at 12 noon. Patient not taking: Reported on 04/11/2022    [provider]      Allergies    Azithromycin and Other    Review of Systems   Review of Systems  Physical Exam Updated Vital Signs BP 120/89 (BP Location: Right Arm)   Pulse 99   Temp 98.2 F (36.8 C) (Oral)   Resp 16   Ht 5\' 6"  (1.676 m)   Wt 59 kg   LMP 09/13/2022 (Approximate)   SpO2 100%   Breastfeeding No   BMI 20.98 kg/m  Physical Exam Vitals and nursing  note reviewed.  Constitutional:      Appearance: She is well-developed.  HENT:     Head: Normocephalic and atraumatic.     Mouth/Throat:     Mouth: Mucous membranes are moist.  Eyes:     Pupils: Pupils are equal, round, and reactive to light.  Cardiovascular:     Rate and Rhythm: Normal rate and regular rhythm.  Pulmonary:     Effort: No respiratory distress.     Breath sounds: No stridor.  Abdominal:     General: Abdomen is flat. There is no distension.  Musculoskeletal:        General: Normal range of motion.     Cervical back: Normal range of motion.  Skin:    General: Skin is warm and dry.     Comments: 2x4 centimeter area of induration, warmth, erythema and fluctuance consistent with abscess.  Neurological:     General: No focal deficit present.     Mental Status: She is alert.     ED Results / Procedures / Treatments   Labs (all labs ordered are listed, but only abnormal results are displayed) Labs Reviewed - No data to display  EKG None  Radiology No results found.  Procedures Procedures  {Document cardiac monitor, telemetry assessment procedure when appropriate:1}  Medications Ordered in ED Medications  lidocaine-EPINEPHrine (XYLOCAINE W/EPI) 2 %-1:200000 (PF) injection 20  mL (has no administration in time range)  oxyCODONE-acetaminophen (PERCOCET/ROXICET) 5-325 MG per tablet 2 tablet (has no administration in time range)  sulfamethoxazole-trimethoprim (BACTRIM DS) 800-160 MG per tablet 1 tablet (has no administration in time range)    ED Course/ Medical Decision Making/ A&P                           Medical Decision Making Risk Prescription drug management.   Abscess in left axilla.  Will I&D.  Antibiotics will be started.  Also discussed doing chlorhexidine washes of mouth and body and mupirocin in the nasal cavity.  ***  {Document critical care time when appropriate:1} {Document review of labs and clinical decision tools ie heart score,  Chads2Vasc2 etc:1}  {Document your independent review of radiology images, and any outside records:1} {Document your discussion with family members, caretakers, and with consultants:1} {Document social determinants of health affecting pt's care:1} {Document your decision making why or why not admission, treatments were needed:1} Final Clinical Impression(s) / ED Diagnoses Final diagnoses:  None    Rx / DC Orders ED Discharge Orders     None

## 2022-10-25 NOTE — ED Triage Notes (Signed)
Pt reports abscess under left axilla that she noticed 2 weeks ago that is progressively getting larger. She denies drainage.

## 2022-10-26 MED ORDER — SULFAMETHOXAZOLE-TRIMETHOPRIM 800-160 MG PO TABS: 1.0000 | ORAL_TABLET | Freq: Two times a day (BID) | ORAL | 0 refills | Status: AC

## 2022-10-26 MED ORDER — IBUPROFEN 100 MG/5ML PO SUSP: 400.0000 mg | Freq: Four times a day (QID) | ORAL | 0 refills | Status: AC

## 2022-10-26 MED ORDER — OXYCODONE HCL 5 MG/5ML PO SOLN: 5.0000 mg | ORAL | 0 refills | Status: DC | PRN

## 2023-08-10 ENCOUNTER — Emergency Department (HOSPITAL_BASED_OUTPATIENT_CLINIC_OR_DEPARTMENT_OTHER)
Admission: EM | Admit: 2023-08-10 | Discharge: 2023-08-10 | Disposition: A | Discharge status: Home / Self Care | Payer: Medicaid Other | Attending: Emergency Medicine | Admitting: Emergency Medicine

## 2023-08-10 ENCOUNTER — Encounter (HOSPITAL_BASED_OUTPATIENT_CLINIC_OR_DEPARTMENT_OTHER): Payer: Self-pay

## 2023-08-10 ENCOUNTER — Other Ambulatory Visit: Payer: Self-pay

## 2023-08-10 DIAGNOSIS — K0889 Other specified disorders of teeth and supporting structures: Secondary | ICD-10-CM | POA: Insufficient documentation

## 2023-08-10 DIAGNOSIS — K029 Dental caries, unspecified: Secondary | ICD-10-CM | POA: Diagnosis not present

## 2023-08-10 MED ORDER — KETOROLAC TROMETHAMINE 15 MG/ML IJ SOLN
15.0000 mg | Freq: Once | INTRAMUSCULAR | Status: AC
  Administered 2023-08-10: 15 mg via INTRAMUSCULAR
  Filled 2023-08-10: qty 1

## 2023-08-10 MED ORDER — OXYCODONE HCL 5 MG PO TABS: 5.0000 mg | ORAL_TABLET | Freq: Four times a day (QID) | ORAL | 0 refills | Status: DC | PRN

## 2023-08-10 MED ORDER — AMOXICILLIN-POT CLAVULANATE 400-57 MG/5ML PO SUSR: 875.0000 mg | Freq: Two times a day (BID) | ORAL | 0 refills | Status: AC

## 2023-08-10 NOTE — ED Provider Notes (Signed)
[place] EMERGENCY DEPARTMENT AT [facility] Provider Note   CSN: [id number] Arrival date & time: 08/10/23  1942     History  Chief Complaint  Patient presents with   Dental Pain    Michaela Roberson is a 27 y.o. female.  Patient here for reevaluation dental pain.  She was seen earlier today given antibiotics and pain medicine but she still having significant pain to her upper teeth.  Nothing is making it better.  Been hard to focus.  No trouble eating drinking.  No fever or chills.  The history is provided by the patient.       Home Medications Prior to Admission medications   Medication Sig Start Date End Date Taking? Authorizing Provider  oxyCODONE (ROXICODONE) 5 MG immediate release tablet Take 1 tablet (5 mg total) by mouth every 6 (six) hours as needed for up to 10 doses. 08/10/23  Yes Darvell Monteforte, DO  amoxicillin-clavulanate (AUGMENTIN) 400-57 MG/5ML suspension Take 10.9 mLs (875 mg total) by mouth 2 (two) times daily for 7 days. 08/10/23 08/17/23  Prosperi, Christian H, PA-C  famotidine (PEPCID) 20 MG tablet Take 1 tablet (20 mg total) by mouth 2 (two) times daily. 06/08/22 06/08/23  Aviva Signs, CNM      Allergies    Azithromycin and Other    Review of Systems   Review of Systems  Physical Exam Updated Vital Signs BP (!) 119/90 (BP Location: Right Arm)   Pulse 88   Temp 97.7 F (36.5 C)   Resp 18   Ht 5\' 6"  (1.676 m)   Wt 61.2 kg   LMP 08/05/2023 (Exact Date)   SpO2 99%   BMI 21.78 kg/m  Physical Exam HENT:     Head: Normocephalic.     Comments: Right upper abdominal pain with some mild swelling focal    Nose: Nose normal.  Eyes:     Pupils: Pupils are equal, round, and reactive to light.  Neurological:     General: No focal deficit present.     Mental Status: She is alert.     ED Results / Procedures / Treatments   Labs (all labs ordered are listed, but only abnormal results are displayed) Labs Reviewed - No data to  display  EKG None  Radiology No results found.  Procedures Procedures    Medications Ordered in ED Medications - No data to display  ED Course/ Medical Decision Making/ A&P                                 Medical Decision Making Risk Prescription drug management.   Michaela Roberson is here with dental pain.  Already started some antibiotics prescribed today at other emergency visit.  She still have a lot of discomfort despite trying some OTCs.  Dental pain and swelling over the right upper teeth.  No trismus or drooling.  I have no concern for major infectious process but overall this is pretty painful.  Recommend Tylenol and ibuprofen.  I will write her for short course of oxycodone for breakthrough pain.  She was educated thoroughly about this medicine.  Told not to use alcohol drugs or other dangerous activities including driving while taking this medicine.  Strongly urged her not to take it unless it was due to breakthrough pain.  I have given her information to follow-up with dentistry.  Discharged in good condition.  Understands return precautions.  This chart was  dictated using voice recognition software.  Despite best efforts to proofread,  errors can occur which can change the documentation meaning.         Final Clinical Impression(s) / ED Diagnoses Final diagnoses:  Pain, dental    Rx / DC Orders ED Discharge Orders          Ordered    oxyCODONE (ROXICODONE) 5 MG immediate release tablet  Every 6 hours PRN        08/10/23 2059              Virgina Norfolk, DO 08/10/23 2101

## 2023-08-10 NOTE — Discharge Instructions (Signed)
Please use Tylenol or ibuprofen for pain.  You may use 600 mg ibuprofen every 6 hours or 1000 mg of Tylenol every 6 hours.  You may choose to alternate between the 2.  This would be most effective.  Not to exceed 4 g of Tylenol within 24 hours.  Not to exceed 3200 mg ibuprofen 24 hours.  

## 2023-08-10 NOTE — ED Triage Notes (Signed)
POV from home, A&O x 4, GCS 15, amb to triage  Pt c/o tooth pain, seen [place]. Given amoxicillin and been taking ibuprofen without relief.

## 2023-08-10 NOTE — ED Provider Notes (Signed)
[place] EMERGENCY DEPARTMENT AT [facility] Provider Note   CSN: [id number] Arrival date & time: 08/10/23  1049     History  Chief Complaint  Patient presents with   Dental Pain    Michaela Roberson is a 27 y.o. female with overall noncontributory past medical history who presents with right lower dental pain for three days. She has some swelling to right side of face. Has not been able to find dentist. Has been using ibuprofen, tylenol, orajel with minimal relief. Concerned about abscess.   Dental Pain      Home Medications Prior to Admission medications   Medication Sig Start Date End Date Taking? Authorizing Provider  amoxicillin-clavulanate (AUGMENTIN) 400-57 MG/5ML suspension Take 10.9 mLs (875 mg total) by mouth 2 (two) times daily for 7 days. 08/10/23 08/17/23 Yes Manal Kreutzer H, PA-C  famotidine (PEPCID) 20 MG tablet Take 1 tablet (20 mg total) by mouth 2 (two) times daily. 06/08/22 06/08/23  Aviva Signs, CNM  oxyCODONE (ROXICODONE) 5 MG/5ML solution Take 5 mLs (5 mg total) by mouth every 4 (four) hours as needed for severe pain. 10/26/22   Mesner, Barbara Cower, MD      Allergies    Azithromycin and Other    Review of Systems   Review of Systems  All other systems reviewed and are negative.   Physical Exam Updated Vital Signs BP (!) 127/94   Pulse 85   Temp 98.7 F (37.1 C) (Oral)   Resp 18   Ht 5\' 6"  (1.676 m)   Wt 61.2 kg   LMP 08/05/2023 (Exact Date)   SpO2 100%   BMI 21.79 kg/m  Physical Exam Vitals and nursing note reviewed.  Constitutional:      General: She is not in acute distress.    Appearance: Normal appearance.  HENT:     Head: Normocephalic and atraumatic.     Mouth/Throat:     Comments: No significant posterior oropharynx erythema, swelling, exudate. Uvula midline, tonsils 1+ bilaterally.  No trismus, stridor, evidence of PTA, floor of mouth swelling or redness.   Soft tissue swelling, broken teeth and bottom right, some  redness of surrounding gums.  No purulent drainage, no area of discrete fluctuance requiring drainage. Eyes:     General:        Right eye: No discharge.        Left eye: No discharge.  Cardiovascular:     Rate and Rhythm: Normal rate and regular rhythm.  Pulmonary:     Effort: Pulmonary effort is normal. No respiratory distress.  Musculoskeletal:        General: No deformity.  Skin:    General: Skin is warm and dry.  Neurological:     Mental Status: She is alert and oriented to person, place, and time.  Psychiatric:        Mood and Affect: Mood normal.        Behavior: Behavior normal.     ED Results / Procedures / Treatments   Labs (all labs ordered are listed, but only abnormal results are displayed) Labs Reviewed - No data to display  EKG None  Radiology No results found.  Procedures Procedures    Medications Ordered in ED Medications  ketorolac (TORADOL) 15 MG/ML injection 15 mg (has no administration in time range)    ED Course/ Medical Decision Making/ A&P  Medical Decision Making Risk Prescription drug management.    This an overall well-appearing 27 y.o. female who presents with concern for dental pain.  Physical exam reveals cavities, broken teeth in mouth.  Patient with redness, gum swelling without evidence of gum abscess, periapical abscess, PTA, uvula deviation, pharyngitis, epiglottitis, dysphonia, stridor.  Patient without new difficulty swallowing.  No systemic fever, chills.  Given patient's symptoms think it is reasonable to treat with antibiotics.  Encouraged ibuprofen, Tylenol, Orajel, ice for pain control. Considered stronger pain control toradol shot x 1. Encouraged urgent dental follow-up, dental resource guide provided.  Provided Augmentin for antibiotic coverage.  Patient discharged in stable condition at this time, return precautions given.  Final Clinical Impression(s) / ED Diagnoses Final diagnoses:   Pain due to dental caries    Rx / DC Orders ED Discharge Orders          Ordered    amoxicillin-clavulanate (AUGMENTIN) 400-57 MG/5ML suspension  2 times daily        08/10/23 1140              Natha Guin, Lynndyl H, PA-C 08/10/23 1145    Terald Sleeper, MD 08/10/23 1228

## 2023-08-10 NOTE — ED Notes (Signed)
D/c paperwork reviewed with pt, including prescriptions and follow up care.  All questions and/or concerns addressed at time of d/c.  No further needs expressed. . Pt verbalized understanding, Ambulatory without assistance to ED exit, NAD.   

## 2023-08-10 NOTE — ED Triage Notes (Signed)
The patient having right lower dental pain for three days. She has swelling to right side of her face.

## 2023-10-08 ENCOUNTER — Emergency Department (HOSPITAL_BASED_OUTPATIENT_CLINIC_OR_DEPARTMENT_OTHER)
Admission: EM | Admit: 2023-10-08 | Discharge: 2023-10-08 | Disposition: A | Discharge status: Home / Self Care | Payer: Medicaid Other | Attending: Emergency Medicine | Admitting: Emergency Medicine

## 2023-10-08 ENCOUNTER — Encounter (HOSPITAL_BASED_OUTPATIENT_CLINIC_OR_DEPARTMENT_OTHER): Payer: Self-pay | Admitting: Pediatrics

## 2023-10-08 ENCOUNTER — Other Ambulatory Visit: Payer: Self-pay

## 2023-10-08 DIAGNOSIS — K0889 Other specified disorders of teeth and supporting structures: Secondary | ICD-10-CM | POA: Insufficient documentation

## 2023-10-08 DIAGNOSIS — J45909 Unspecified asthma, uncomplicated: Secondary | ICD-10-CM | POA: Insufficient documentation

## 2023-10-08 MED ORDER — OXYCODONE-ACETAMINOPHEN 5-325 MG PO TABS
1.0000 | ORAL_TABLET | Freq: Once | ORAL | Status: AC
  Administered 2023-10-08: 1 via ORAL
  Filled 2023-10-08: qty 1

## 2023-10-08 MED ORDER — KETOROLAC TROMETHAMINE 15 MG/ML IJ SOLN
15.0000 mg | Freq: Once | INTRAMUSCULAR | Status: AC
  Administered 2023-10-08: 15 mg via INTRAMUSCULAR
  Filled 2023-10-08: qty 1

## 2023-10-08 MED ORDER — IBUPROFEN 100 MG/5ML PO SUSP: 10.0000 mg/kg | Freq: Four times a day (QID) | ORAL | 0 refills | Status: DC | PRN

## 2023-10-08 NOTE — ED Provider Notes (Signed)
[place] EMERGENCY DEPARTMENT AT [facility] Provider Note   CSN: [id number] Arrival date & time: 10/08/23  1324     History  Chief Complaint  Patient presents with   Dental Pain    Michaela Roberson is a 27 y.o. female with medical history of anemia, asthma, recent bilateral upper and lower was in tooth extraction as well as to "rotten teeth".  The patient presents to the ED for evaluation of dental pain.  States that on Thursday she had 4 wisdom teeth removed as well as 2 "rotten teeth".  She reports that the pain persisted and today she went back to the dentist where they removed the stitches as they thought that they were causing "irritation".  She was also provided with Dukes Magic mouthwash which he states "only makes the pain worse".  On chart review, the patient was prescribed acetaminophen with codeine on 10/31.  She reports that she has been taking this without relief of her symptoms.  She also states that she has been taking ibuprofen.  The patient is very agitated on my examination.  The room smells of smoke however she denies smoking.  She is unsure if she has had any fevers.  She denies nausea, vomiting, body aches or chills, trouble swallowing or drooling.   Dental Pain Associated symptoms: no fever        Home Medications Prior to Admission medications   Medication Sig Start Date End Date Taking? Authorizing Provider  famotidine (PEPCID) 20 MG tablet Take 1 tablet (20 mg total) by mouth 2 (two) times daily. 06/08/22 06/08/23  Aviva Signs, CNM  oxyCODONE (ROXICODONE) 5 MG immediate release tablet Take 1 tablet (5 mg total) by mouth every 6 (six) hours as needed for up to 10 doses. 08/10/23   Curatolo, Adam, Michaela Roberson      Allergies    Azithromycin and Other    Review of Systems   Review of Systems  Constitutional:  Negative for fever.  HENT:  Positive for dental problem.   All other systems reviewed and are negative.   Physical Exam Updated Vital Signs BP  (!) 126/95 (BP Location: Left Arm)   Pulse 86   Temp 97.9 F (36.6 C) (Oral)   Resp 16   Ht 5\' 4"  (1.626 m)   Wt 59 kg   LMP 10/01/2023   SpO2 98%   BMI 22.31 kg/m  Physical Exam Vitals and nursing note reviewed.  Constitutional:      General: She is not in acute distress.    Appearance: Normal appearance. She is not ill-appearing, toxic-appearing or diaphoretic.  HENT:     Head: Normocephalic and atraumatic.     Nose: Nose normal.     Mouth/Throat:     Mouth: Mucous membranes are moist.     Pharynx: Oropharynx is clear. No oropharyngeal exudate or posterior oropharyngeal erythema.     Comments: No signs of infection to posterior oropharynx Eyes:     Extraocular Movements: Extraocular movements intact.     Conjunctiva/sclera: Conjunctivae normal.     Pupils: Pupils are equal, round, and reactive to light.  Cardiovascular:     Rate and Rhythm: Normal rate and regular rhythm.  Pulmonary:     Effort: Pulmonary effort is normal.     Breath sounds: Normal breath sounds. No wheezing.  Abdominal:     General: Abdomen is flat. Bowel sounds are normal.     Palpations: Abdomen is soft.     Tenderness: There is  no abdominal tenderness.  Skin:    General: Skin is warm and dry.     Capillary Refill: Capillary refill takes less than 2 seconds.  Neurological:     Mental Status: She is alert and oriented to person, place, and time.     ED Results / Procedures / Treatments   Labs (all labs ordered are listed, but only abnormal results are displayed) Labs Reviewed - No data to display  EKG None  Radiology No results found.  Procedures Procedures   Medications Ordered in ED Medications  ketorolac (TORADOL) 15 MG/ML injection 15 mg (15 mg Intramuscular Given 10/08/23 1522)  oxyCODONE-acetaminophen (PERCOCET/ROXICET) 5-325 MG per tablet 1 tablet (1 tablet Oral Given 10/08/23 1520)    ED Course/ Medical Decision Making/ A&P   Medical Decision Making  27 year old female  presents for evaluation of dental pain.  Please see HPI for further details.  On examination the patient posterior oropharynx shows no signs of infection.  Patient states that she just saw her dentist this morning where she had sutures removed as they thought they were causing irritation.  She was prescribed Dukes Magic mouthwash.  She has already been prescribed acetaminophen with codeine and this was on 10/31.  This was 5 days ago.  I advised the patient that I was happy to try and treat her pain here but I could not prescribe her a long-term prescription for narcotics.  The patient grew extremely irate, threatening me stating that she just "wanted her fucking pain to end".  Patient was advised once more that I was happy to treat her pain here but the patient continued to grow irritated.  She demanded to speak to an Production designer, theatre/television/film or the attending.  I advised my attending, Dr. Dalene Seltzer, of this.  Charge nurse also engaged with the patient and tried to explain to her that since she was already under the care of another provider, she should be following up with him for her pain medication issues.  Attending went and engaged with patient.  Patient provided ibuprofen prescription.  Patient discharged.   Final Clinical Impression(s) / ED Diagnoses Final diagnoses:  Pain, dental    Rx / DC Orders ED Discharge Orders     None         Al Decant, PA-C 10/08/23 1546    Laurence Spates, MD 10/08/23 1955

## 2023-10-08 NOTE — Discharge Instructions (Addendum)
Please follow up with your dentist.

## 2023-10-08 NOTE — ED Notes (Signed)
Pt yelling in room, PA and security at bedside.  This RN called to room, patient stating all we ever do is just give pain medication. Pt stating she just wants the problem resolved and doesn't want any more pain meds. This RN explained to patient that dental issues are often not resolved in the ER and need to be followed up with by the dentist. Pt states she has been calling the dentist but they aren't answering. Pt understands explanation by this RN, and has become more cooperative and calm. Agreeable to pain meds in ED, attempting to call dentist again who did her extraction on Thursday.

## 2023-10-08 NOTE — ED Triage Notes (Signed)
C/O severe dental pain; s/p wisdom teeth and 2 other teeth extracted last Thursday,

## 2023-12-12 ENCOUNTER — Emergency Department (HOSPITAL_COMMUNITY)
Admission: EM | Admit: 2023-12-12 | Discharge: 2023-12-12 | Disposition: A | Discharge status: Home / Self Care | Payer: Medicaid Other

## 2023-12-12 ENCOUNTER — Encounter (HOSPITAL_COMMUNITY): Payer: Self-pay | Admitting: Emergency Medicine

## 2023-12-12 ENCOUNTER — Encounter: Payer: Self-pay | Admitting: Emergency Medicine

## 2023-12-12 ENCOUNTER — Ambulatory Visit
Admission: EM | Admit: 2023-12-12 | Discharge: 2023-12-12 | Disposition: A | Discharge status: Home / Self Care | Payer: Medicaid Other

## 2023-12-12 DIAGNOSIS — Z20822 Contact with and (suspected) exposure to covid-19: Secondary | ICD-10-CM | POA: Insufficient documentation

## 2023-12-12 DIAGNOSIS — B349 Viral infection, unspecified: Secondary | ICD-10-CM | POA: Insufficient documentation

## 2023-12-12 DIAGNOSIS — R112 Nausea with vomiting, unspecified: Secondary | ICD-10-CM

## 2023-12-12 DIAGNOSIS — J45909 Unspecified asthma, uncomplicated: Secondary | ICD-10-CM | POA: Insufficient documentation

## 2023-12-12 DIAGNOSIS — R55 Syncope and collapse: Secondary | ICD-10-CM

## 2023-12-12 LAB — RESP PANEL BY RT-PCR (RSV, FLU A&B, COVID)  RVPGX2
Influenza A by PCR: NEGATIVE
Influenza B by PCR: NEGATIVE
Resp Syncytial Virus by PCR: NEGATIVE
SARS Coronavirus 2 by RT PCR: NEGATIVE

## 2023-12-12 MED ORDER — ONDANSETRON 4 MG PO TBDP
4.0000 mg | ORAL_TABLET | Freq: Once | ORAL | Status: AC
  Administered 2023-12-12: 4 mg via ORAL

## 2023-12-12 MED ORDER — BENZONATATE 100 MG PO CAPS: 100.0000 mg | ORAL_CAPSULE | Freq: Three times a day (TID) | ORAL | 0 refills | Status: DC

## 2023-12-12 MED ORDER — IBUPROFEN 100 MG/5ML PO SUSP: 10.0000 mg/kg | Freq: Four times a day (QID) | ORAL | 0 refills | Status: DC | PRN

## 2023-12-12 NOTE — ED Notes (Signed)
 Patient is being discharged from the Urgent Care and sent to the Emergency Department via Ambulance (Non Emergency Transport), Spoke with Denita & Last name: Carlo. Per R. Billy RIGGERS, patient is in need of higher level of care due to Nausea/Vomiting, Fatigue, Unsteadiness recent Syncope Episode. Patient is aware and verbalizes understanding of plan of care.  Vitals:   12/12/23 1321  BP: 118/88  Pulse: 97  Resp: 16  Temp: 98.4 F (36.9 C)  SpO2: 96%

## 2023-12-12 NOTE — ED Provider Notes (Signed)
 [place] EMERGENCY DEPARTMENT AT [facility] Provider Note   CSN: [id number] Arrival date & time: 12/12/23  1512     History  Chief Complaint  Patient presents with   Emesis    Michaela Roberson is a 28 y.o. female.  The history is provided by the patient and medical records. No language interpreter was used.  Emesis    28 yo F presents with 3-day history of vomiting and cough. The patient reports that she has been feeling bad since Sunday. The patient reports being exposed to multiple sick contacts at work and believes she has now passed this to her kids. Since Sunday she been vomiting and unable to eat anything. However, she has been able to drink water. She was brought in by EMS because she passed out from throwing up so much. She describes the emesis as thick and white, although yesterday it was yellow with streaks of blood that she attributes to throat irritation.  She is also complaining of chest tightness that began 4 hours ago, stating it feels like she was punched in the chest. She also has some stomach cramping that she attributes to her period starting.   Her kids were evaluated in the peds ED and tested positive for the flu. The only medication she has has taken since this began is motrin . The motrin  relieved her pain. She denies any diarrhea.   Home Medications Prior to Admission medications   Medication Sig Start Date End Date Taking? Authorizing Provider  benzonatate  (TESSALON ) 100 MG capsule Take 1 capsule (100 mg total) by mouth every 8 (eight) hours. 12/12/23  Yes Nivia Colon, PA-C  acetaminophen  (LIQUID ACETAMINOPHEN ) 160 MG/5ML liquid Take by mouth. 06/27/22   [provider]  famotidine  (PEPCID ) 20 MG tablet Take 1 tablet (20 mg total) by mouth 2 (two) times daily. Patient not taking: Reported on 12/12/2023 06/08/22 06/08/23  Trudy Earnie CROME, CNM  ibuprofen  (ADVIL ) 100 MG/5ML suspension Take 29.5 mLs (590 mg total) by mouth every 6 (six) hours as  needed for fever, mild pain (pain score 1-3) or moderate pain (pain score 4-6). 12/12/23   Nivia Colon, PA-C  oxyCODONE  (ROXICODONE ) 5 MG immediate release tablet Take 1 tablet (5 mg total) by mouth every 6 (six) hours as needed for up to 10 doses. Patient not taking: Reported on 12/12/2023 08/10/23   Ruthe Cornet, DO      Allergies    Azithromycin , Other, and Mushroom    Review of Systems   Review of Systems  Gastrointestinal:  Positive for vomiting.  All other systems reviewed and are negative.   Physical Exam Updated Vital Signs BP 125/78 (BP Location: Left Arm)   Pulse 92   Temp 98.9 F (37.2 C) (Oral)   Resp 18   LMP 12/10/2023 (Exact Date)   SpO2 100%  Physical Exam Vitals and nursing note reviewed.  Constitutional:      General: She is not in acute distress.    Appearance: She is well-developed.  HENT:     Head: Atraumatic.  Eyes:     Conjunctiva/sclera: Conjunctivae normal.  Cardiovascular:     Rate and Rhythm: Normal rate and regular rhythm.     Pulses: Normal pulses.     Heart sounds: Normal heart sounds.  Pulmonary:     Effort: Pulmonary effort is normal.  Abdominal:     Palpations: Abdomen is soft.     Tenderness: There is no abdominal tenderness.  Musculoskeletal:  General: Normal range of motion.     Cervical back: Neck supple.  Skin:    Findings: No rash.  Neurological:     Mental Status: She is alert and oriented to person, place, and time.  Psychiatric:        Mood and Affect: Mood normal.     ED Results / Procedures / Treatments   Labs (all labs ordered are listed, but only abnormal results are displayed) Labs Reviewed  RESP PANEL BY RT-PCR (RSV, FLU A&B, COVID)  [id number]  BASIC METABOLIC PANEL  CBC  URINALYSIS, ROUTINE W REFLEX MICROSCOPIC  HCG, SERUM, QUALITATIVE  CBG MONITORING, ED    EKG None  Radiology No results found.  Procedures Procedures    Medications Ordered in ED Medications - No data to display  ED Course/  Medical Decision Making/ A&P                                 Medical Decision Making Amount and/or Complexity of Data Reviewed Labs: ordered.  Risk Prescription drug management.   BP 125/78 (BP Location: Left Arm)   Pulse 92   Temp 98.9 F (37.2 C) (Oral)   Resp 18   LMP 12/10/2023 (Exact Date)   SpO2 100%   19:63 PM 28 year old female significant history of anemia, asthma, depression, presenting with cold symptoms.  Patient report for the past 3 days she has had runny nose sneezing coughing along with nausea and vomiting.  States she does not have much of an appetite and today while she was after the vomiting she had a brief syncopal episode.  2 of her kids are also sick with similar symptoms and was brought here to be evaluated.  Patient found out that her kids test positive for the flu.  She believes she may have had the same illness.  Exam overall reassuring patient is well-appearing lungs clear to auscultation heart with normal rate and rhythm, abdomen soft nontender.   respiratory panel obtained independently interpreted by me and negative for COVID flu and RSV  Since patient mention syncope, as she has history of anemia, initially plan on obtaining EKG, checking CBC, basic labs, UA and pregnancy test however patient prefers to go home at this time as her ride has arrived.  She states she is currently on her menstruation which makes pregnancy less likely.  Her symptoms could be related to symptomatic anemia, but without blood work it is difficult to assess for that.  However her vital signs overall stable and normal.  Will discharge home with supportive care however patient should return if her symptoms progress.  Patient voiced understanding and agrees to plan.         Final Clinical Impression(s) / ED Diagnoses Final diagnoses:  Viral illness    Rx / DC Orders ED Discharge Orders          Ordered    ibuprofen  (ADVIL ) 100 MG/5ML suspension  Every 6 hours PRN         12/12/23 2142    benzonatate  (TESSALON ) 100 MG capsule  Every 8 hours        12/12/23 2142              Nivia Colon, PA-C 12/12/23 2156    Ula Prentice SAUNDERS, MD 12/12/23 2340

## 2023-12-12 NOTE — Progress Notes (Signed)
 CSW and Cherish, LCSW met with patient at bedside to discuss discharge planning. Patient states she has been sick the last few days and has not been able to keep any food down for 3 days. Patient reports she kept her children out of school today due to her youngest child having a fever. Patient reports her grandma that lives in [place] and aunt that lives in [place] are her only supports. Patient reports the father of children are not involved. Patient reports her Wylie Gibson is arriving at the hospital and that all children can be discharged home with [facility]. Patient requested a ginger ale which CSW provided to her.   Pediatric NP arrived in room to obtain consent to treat the children in the Peds ED.   No further TOC needs at this time.  Niels Portugal, MSW, LCSW Transitions of Care  Clinical Social Worker II [phone]

## 2023-12-12 NOTE — ED Triage Notes (Signed)
 Pt reports nausea and vomiting x3 days. Pt reports so far today she has not vomited just coughed up mucus. She reports her emesis was thick and white. No fever today, but has had fevers at home. Pt reports decreased appetite and fluid intake. Very fatigued and stumbling in lobby. PT reports LOC earlier today.

## 2023-12-12 NOTE — ED Notes (Addendum)
 After speaking to (Last name: Carlo) @ Non Emergency Transport Number @ [phone] I have let him know that the patient has 5 kids with her with no other adult. He states that he will let transport know, the process is they will call police who will  transport or stay with kids until other adult arrives.

## 2023-12-12 NOTE — ED Provider Notes (Signed)
 Patient here today for evaluation of nausea and vomiting she has had the last 3 days. She has not had fever today but has had fever prior. She notes she passed out at home earlier. She is still very fatigued and lightheaded. Discussed that given syncopal episode I would recommend further evaluation in the ED and recommended EMS transport given concerns for safety driving given lightheadedness. Patient is agreeable to same.    Billy Asberry FALCON, PA-C 12/12/23 [phone]

## 2023-12-12 NOTE — ED Triage Notes (Signed)
 PT BIB EMS for n/v and lightheaded started today. Pt wants to be tested for the flu

## 2023-12-19 ENCOUNTER — Ambulatory Visit
Admission: EM | Admit: 2023-12-19 | Discharge: 2023-12-19 | Disposition: A | Discharge status: Home / Self Care | Payer: Medicaid Other | Attending: Physician Assistant | Admitting: Physician Assistant

## 2023-12-19 ENCOUNTER — Encounter: Payer: Self-pay | Admitting: Emergency Medicine

## 2023-12-19 DIAGNOSIS — R3915 Urgency of urination: Secondary | ICD-10-CM | POA: Insufficient documentation

## 2023-12-19 DIAGNOSIS — N898 Other specified noninflammatory disorders of vagina: Secondary | ICD-10-CM | POA: Diagnosis present

## 2023-12-19 LAB — POCT URINALYSIS DIP (MANUAL ENTRY)
Blood, UA: NEGATIVE
Glucose, UA: NEGATIVE mg/dL
Ketones, POC UA: NEGATIVE mg/dL
Nitrite, UA: NEGATIVE
Protein Ur, POC: 100 mg/dL — AB
Spec Grav, UA: 1.025 (ref 1.010–1.025)
Urobilinogen, UA: >=8 U/dL — AB
pH, UA: 7.5 (ref 5.0–8.0)

## 2023-12-19 LAB — POCT URINE PREGNANCY: Preg Test, Ur: NEGATIVE

## 2023-12-19 MED ORDER — METRONIDAZOLE 500 MG PO TABS: 500.0000 mg | ORAL_TABLET | Freq: Two times a day (BID) | ORAL | 0 refills | Status: AC

## 2023-12-19 NOTE — Discharge Instructions (Signed)
 We are treating you for bacterial vaginosis.  Start metronidazole  twice daily for 7 days.  Do not drink any alcohol while on this medication for 3 days after finishing the medicine to prevent nausea/vomiting side effects.  Wear loosefitting cotton underwear and use hypoallergenic soaps and detergents.  We will contact you if we need to arrange additional treatment.  If you have any worsening or changing symptoms including pelvic pain, abdominal pain, fever, nausea, vomiting you need to be seen immediately.

## 2023-12-19 NOTE — ED Triage Notes (Signed)
 Patient c/o watery vaginal discharge x 1 week, noticed after taken antibiotics for a dental infection.  Denies any odor or itching.  Denies any OTC meds.
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
## 2023-12-20 LAB — URINE CULTURE: Culture: <10000 — AB

## 2023-12-20 LAB — CERVICOVAGINAL ANCILLARY ONLY
Bacterial Vaginitis (gardnerella): POSITIVE — AB
Candida Glabrata: NEGATIVE
Candida Vaginitis: NEGATIVE
Chlamydia: NEGATIVE
Comment: NEGATIVE
Comment: NEGATIVE
Comment: NEGATIVE
Comment: NEGATIVE
Comment: NEGATIVE
Comment: NORMAL
Neisseria Gonorrhea: NEGATIVE
Trichomonas: NEGATIVE
